# Patient Record
Sex: Female | Born: 1965 | Hispanic: No | Marital: Married | Smoking: Never smoker
Health system: Southern US, Community
[De-identification: ages and names within clinical notes are randomized; demographics above are authoritative.]

## PROBLEM LIST (undated history)

## (undated) DIAGNOSIS — N200 Calculus of kidney: Secondary | ICD-10-CM

## (undated) DIAGNOSIS — E559 Vitamin D deficiency, unspecified: Secondary | ICD-10-CM

## (undated) HISTORY — DX: Vitamin D deficiency, unspecified: E55.9

## (undated) HISTORY — DX: Calculus of kidney: N20.0

## (undated) HISTORY — PX: HERNIA REPAIR: SHX51

## (undated) HISTORY — PX: INGUINAL HERNIA REPAIR: SHX194

---

## 1998-01-08 ENCOUNTER — Other Ambulatory Visit: Admission: RE | Admit: 1998-01-08 | Discharge: 1998-01-08 | Payer: Self-pay | Admitting: *Deleted

## 1999-05-11 ENCOUNTER — Other Ambulatory Visit: Admission: RE | Admit: 1999-05-11 | Discharge: 1999-05-11 | Payer: Self-pay | Admitting: *Deleted

## 2000-05-22 ENCOUNTER — Other Ambulatory Visit: Admission: RE | Admit: 2000-05-22 | Discharge: 2000-05-22 | Payer: Self-pay | Admitting: *Deleted

## 2000-06-20 ENCOUNTER — Encounter: Admission: RE | Admit: 2000-06-20 | Discharge: 2000-06-20 | Payer: Self-pay | Admitting: *Deleted

## 2000-06-20 ENCOUNTER — Encounter: Payer: Self-pay | Admitting: *Deleted

## 2001-07-18 ENCOUNTER — Other Ambulatory Visit: Admission: RE | Admit: 2001-07-18 | Discharge: 2001-07-18 | Payer: Self-pay | Admitting: Obstetrics and Gynecology

## 2002-11-22 ENCOUNTER — Other Ambulatory Visit: Admission: RE | Admit: 2002-11-22 | Discharge: 2002-11-22 | Payer: Self-pay | Admitting: Obstetrics and Gynecology

## 2004-05-07 ENCOUNTER — Encounter: Admission: RE | Admit: 2004-05-07 | Discharge: 2004-05-07 | Payer: Self-pay | Admitting: Obstetrics and Gynecology

## 2005-06-24 ENCOUNTER — Encounter: Admission: RE | Admit: 2005-06-24 | Discharge: 2005-06-24 | Payer: Self-pay | Admitting: Obstetrics and Gynecology

## 2006-06-29 ENCOUNTER — Encounter: Admission: RE | Admit: 2006-06-29 | Discharge: 2006-06-29 | Payer: Self-pay | Admitting: Obstetrics and Gynecology

## 2007-08-02 ENCOUNTER — Encounter: Admission: RE | Admit: 2007-08-02 | Discharge: 2007-08-02 | Payer: Self-pay | Admitting: Obstetrics and Gynecology

## 2008-08-12 ENCOUNTER — Encounter: Admission: RE | Admit: 2008-08-12 | Discharge: 2008-08-12 | Payer: Self-pay | Admitting: Obstetrics and Gynecology

## 2009-08-14 ENCOUNTER — Encounter: Admission: RE | Admit: 2009-08-14 | Discharge: 2009-08-14 | Payer: Self-pay | Admitting: Obstetrics and Gynecology

## 2010-07-29 ENCOUNTER — Other Ambulatory Visit: Payer: Self-pay | Admitting: Obstetrics and Gynecology

## 2010-07-29 DIAGNOSIS — Z1231 Encounter for screening mammogram for malignant neoplasm of breast: Secondary | ICD-10-CM

## 2010-09-10 ENCOUNTER — Ambulatory Visit: Payer: Self-pay

## 2010-09-21 ENCOUNTER — Other Ambulatory Visit: Payer: Self-pay | Admitting: Obstetrics and Gynecology

## 2010-09-21 DIAGNOSIS — N644 Mastodynia: Secondary | ICD-10-CM

## 2010-09-21 LAB — HM MAMMOGRAPHY: HM Mammogram: NEGATIVE

## 2010-09-29 ENCOUNTER — Ambulatory Visit
Admission: RE | Admit: 2010-09-29 | Discharge: 2010-09-29 | Disposition: A | Discharge status: Home / Self Care | Payer: BC Managed Care – PPO | Source: Ambulatory Visit | Attending: Obstetrics and Gynecology | Admitting: Obstetrics and Gynecology

## 2010-09-29 DIAGNOSIS — N644 Mastodynia: Secondary | ICD-10-CM

## 2010-10-01 ENCOUNTER — Ambulatory Visit: Payer: Self-pay

## 2011-08-29 ENCOUNTER — Encounter: Payer: Self-pay | Admitting: Family Medicine

## 2011-08-29 ENCOUNTER — Ambulatory Visit (INDEPENDENT_AMBULATORY_CARE_PROVIDER_SITE_OTHER): Payer: BC Managed Care – PPO | Admitting: Family Medicine

## 2011-08-29 VITALS — BP 120/70 | HR 72 | Ht 65.25 in | Wt 144.0 lb

## 2011-08-29 DIAGNOSIS — Z0000 Encounter for general adult medical examination without abnormal findings: Secondary | ICD-10-CM

## 2011-08-29 LAB — POCT URINALYSIS DIPSTICK
Blood, UA: NEGATIVE
Glucose, UA: NEGATIVE
Spec Grav, UA: <=1.005
Urobilinogen, UA: NEGATIVE
pH, UA: 7

## 2011-08-29 LAB — COMPREHENSIVE METABOLIC PANEL
ALT: 11 U/L (ref 0–35)
CO2: 28 mEq/L (ref 19–32)
Calcium: 9.9 mg/dL (ref 8.4–10.5)
Chloride: 104 mEq/L (ref 96–112)
Creat: 0.86 mg/dL (ref 0.50–1.10)
Sodium: 141 mEq/L (ref 135–145)
Total Protein: 7.5 g/dL (ref 6.0–8.3)

## 2011-08-29 LAB — CBC WITH DIFFERENTIAL/PLATELET
Basophils Absolute: 0.1 10*3/uL (ref 0.0–0.1)
Lymphocytes Relative: 27 % (ref 12–46)
Neutro Abs: 3 10*3/uL (ref 1.7–7.7)
Platelets: 177 10*3/uL (ref 150–400)
RDW: 14 % (ref 11.5–15.5)
WBC: 5 10*3/uL (ref 4.0–10.5)

## 2011-08-29 LAB — TSH: TSH: 2.204 u[IU]/mL (ref 0.350–4.500)

## 2011-08-29 LAB — LIPID PANEL: Cholesterol: 257 mg/dL — ABNORMAL HIGH (ref 0–200)

## 2011-08-29 NOTE — Progress Notes (Signed)
Chief Complaint  Patient presents with  . Annual Exam    new patient CPE no pap-sees Marlinda Mike, PA @ [facility]. Last pap 09/20/2010-is scheduled for 09/22/2011 for pap. Hasn't seen a family physician in 8 years. Just wants to establish and have CPE. UA showed trace leuks, pt is asymptomatic.   She has no other specific concerns or complaints.  She recently lost a few friends from illness (ALS, pancreatic cancer) and would like full labwork done today, with copies to be sent to her GYN (NP Marlinda Mike).  No records are available from her GYN yet.  She has some questions regarding supplements for weight loss  Health maintenance: There is no immunization history on file for this patient. She recalls getting immunizations through GYN's office. Has appt next month, and if not UTD, will get booster there. Last Pap smear: 09/2010 Last mammogram: 09/2010 Last colonoscopy: never Last DEXA: never Dentist: once yearly Ophtho: yearly Exercise: 3-4 times/week (bike, elliptical and weights) Had labs through her GYN's office  Past Medical History  Diagnosis Date  . Kidney stone     during pregnancy    Past Surgical History  Procedure Date  . Inguinal hernia repair age 27    left    History   Social History  . Marital Status: Married    Spouse Name: N/A    Number of Children: 1  . Years of Education: N/A   Occupational History  . music teacher    Social History Main Topics  . Smoking status: Never Smoker   . Smokeless tobacco: Never Used  . Alcohol Use: Yes     1 glass 4-5 times per week.  . Drug Use: No  . Sexually Active: Not on file   Other Topics Concern  . 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  Married.  Son lives in [place]    Family History  Problem Relation Age of Onset  . Hypertension Mother   . Hypothyroidism Mother   . Hyperlipidemia Father     . Cancer Maternal Grandmother 70    stomach cancer  . Heart disease Neg Hx   . Diabetes Neg Hx   . Stroke Neg Hx     Current outpatient prescriptions:Multiple Vitamins-Minerals (WOMENS MULTI VITAMIN & MINERAL PO), Take 1 tablet by mouth daily., Disp: , Rfl:   Allergies  Allergen Reactions  . Penicillins Rash    Esp of the hans and feet.   ROS:  The patient denies anorexia, fever, weight changes, headaches,  vision changes, decreased hearing, ear pain, sore throat, breast concerns, chest pain, palpitations, dizziness, syncope, dyspnea on exertion, cough, swelling, nausea, vomiting, diarrhea, constipation, abdominal pain, melena, hematochezia, indigestion/heartburn, hematuria, incontinence, dysuria, irregular menstrual cycles, vaginal discharge, odor or itch, genital lesions, joint pains, numbness, tingling, weakness, tremor, suspicious skin lesions, depression, anxiety, abnormal bleeding/bruising, or enlarged lymph nodes.   PHYSICAL EXAM: BP 120/70  Pulse 72  Ht 5' 5.25" (1.657 m)  Wt 144 lb (65.318 kg)  BMI 23.78 kg/m2  LMP 06/29/2010  General Appearance:    Alert, cooperative, no distress, appears stated age  Head:    Normocephalic, without obvious abnormality, atraumatic  Eyes:    PERRL, conjunctiva/corneas clear, EOM's intact, fundi    benign  Ears:    Normal TM's and external ear canals  Nose:   Nares normal, mucosa normal, no drainage or sinus  tenderness  Throat:   Lips, mucosa, and tongue normal; teeth and gums normal  Neck:   Supple, no lymphadenopathy;  thyroid:  no   enlargement/tenderness/nodules; no carotid   bruit or JVD  Back:    Spine nontender, no curvature, ROM normal, no CVA     tenderness  Lungs:     Clear to auscultation bilaterally without wheezes, rales or     ronchi; respirations unlabored  Chest Wall:    No tenderness or deformity   Heart:    Regular rate and rhythm, S1 and S2 normal, no murmur, rub   or gallop  Breast Exam:    Deferred to GYN   Abdomen:     Soft, non-tender, nondistended, normoactive bowel sounds,    no masses, no hepatosplenomegaly  Genitalia:    Deferred to GYN     Extremities:   No clubbing, cyanosis or edema  Pulses:   2+ and symmetric all extremities  Skin:   Skin color, texture, turgor normal, no rashes or lesions  Lymph nodes:   Cervical, supraclavicular, and axillary nodes normal  Neurologic:   CNII-XII intact, normal strength, sensation and gait; reflexes 2+ and symmetric throughout          Psych:   Normal mood, affect, hygiene and grooming.     ASSESSMENT/PLAN:  1. Routine general medical examination at a health care facility  Visual acuity screening, POCT Urinalysis Dipstick, Comprehensive metabolic panel, CBC with Differential, Vitamin D 25 hydroxy, TSH, Lipid panel    Discussed monthly self breast exams and yearly mammograms after the age of 65; at least 30 minutes of aerobic activity at least 5 days/week; proper sunscreen use reviewed; healthy diet, including goals of calcium and vitamin D intake and alcohol recommendations (less than or equal to 1 drink/day) reviewed; regular seatbelt use; changing batteries in smoke detectors.  Immunization recommendations discussed--need to check on whether she had TdaP through GYN office.  Colonoscopy recommendations reviewed--age 55.  F/u prn

## 2011-08-29 NOTE — Patient Instructions (Signed)
HEALTH MAINTENANCE RECOMMENDATIONS:  It is recommended that you get at least 30 minutes of aerobic exercise at least 5 days/week (for weight loss, you may need as much as 60-90 minutes). This can be any activity that gets your heart rate up. This can be divided in 10-15 minute intervals if needed, but try and build up your endurance at least once a week.  Weight bearing exercise is also recommended twice weekly.  Eat a healthy diet with lots of vegetables, fruits and fiber.  "Colorful" foods have a lot of vitamins (ie green vegetables, tomatoes, red peppers, etc).  Limit sweet tea, regular sodas and alcoholic beverages, all of which has a lot of calories and sugar.  Up to 1 alcoholic drink daily may be beneficial for women (unless trying to lose weight, watch sugars).  Drink a lot of water.  Calcium recommendations are 1200-1500 mg daily (1500 mg for postmenopausal women or women without ovaries), and vitamin D 1000 IU daily.  This should be obtained from diet and/or supplements (vitamins), and calcium should not be taken all at once, but in divided doses.  Monthly self breast exams and yearly mammograms for women over the age of 84 is recommended.  Sunscreen of at least SPF 30 should be used on all sun-exposed parts of the skin when outside between the hours of 10 am and 4 pm (not just when at beach or pool, but even with exercise, golf, tennis, and yard work!)  Use a sunscreen that says "broad spectrum" so it covers both UVA and UVB rays, and make sure to reapply every 1-2 hours.  Remember to change the batteries in your smoke detectors when changing your clock times in the spring and fall.  Use your seat belt every time you are in a car, and please drive safely and not be distracted with cell phones and texting while driving.  TdaP (tetanus and pertussis) booster is recommended if you haven't had one in 10 years (even if the tetanus component is less than 10 years).

## 2011-08-30 ENCOUNTER — Encounter: Payer: Self-pay | Admitting: Family Medicine

## 2011-09-01 ENCOUNTER — Other Ambulatory Visit: Payer: Self-pay | Admitting: Obstetrics and Gynecology

## 2011-09-01 DIAGNOSIS — Z1231 Encounter for screening mammogram for malignant neoplasm of breast: Secondary | ICD-10-CM

## 2011-10-04 ENCOUNTER — Ambulatory Visit: Payer: BC Managed Care – PPO

## 2011-10-28 ENCOUNTER — Ambulatory Visit: Payer: BC Managed Care – PPO

## 2011-11-09 ENCOUNTER — Ambulatory Visit
Admission: RE | Admit: 2011-11-09 | Discharge: 2011-11-09 | Disposition: A | Discharge status: Home / Self Care | Payer: BC Managed Care – PPO | Source: Ambulatory Visit | Attending: Obstetrics and Gynecology | Admitting: Obstetrics and Gynecology

## 2011-11-09 DIAGNOSIS — Z1231 Encounter for screening mammogram for malignant neoplasm of breast: Secondary | ICD-10-CM

## 2011-11-24 ENCOUNTER — Other Ambulatory Visit (INDEPENDENT_AMBULATORY_CARE_PROVIDER_SITE_OTHER): Payer: BC Managed Care – PPO

## 2011-11-24 DIAGNOSIS — Z23 Encounter for immunization: Secondary | ICD-10-CM

## 2012-10-08 ENCOUNTER — Other Ambulatory Visit: Payer: Self-pay

## 2012-10-08 DIAGNOSIS — Z1231 Encounter for screening mammogram for malignant neoplasm of breast: Secondary | ICD-10-CM

## 2012-11-12 ENCOUNTER — Ambulatory Visit
Admission: RE | Admit: 2012-11-12 | Discharge: 2012-11-12 | Disposition: A | Discharge status: Home / Self Care | Payer: BC Managed Care – PPO | Source: Ambulatory Visit

## 2012-11-12 DIAGNOSIS — Z1231 Encounter for screening mammogram for malignant neoplasm of breast: Secondary | ICD-10-CM

## 2013-10-21 ENCOUNTER — Other Ambulatory Visit: Payer: Self-pay

## 2013-10-21 DIAGNOSIS — Z1231 Encounter for screening mammogram for malignant neoplasm of breast: Secondary | ICD-10-CM

## 2013-11-04 ENCOUNTER — Encounter: Payer: Self-pay | Admitting: Family Medicine

## 2013-11-13 ENCOUNTER — Ambulatory Visit
Admission: RE | Admit: 2013-11-13 | Discharge: 2013-11-13 | Disposition: A | Discharge status: Home / Self Care | Payer: BC Managed Care – PPO | Source: Ambulatory Visit

## 2013-11-13 DIAGNOSIS — Z1231 Encounter for screening mammogram for malignant neoplasm of breast: Secondary | ICD-10-CM

## 2013-11-15 ENCOUNTER — Other Ambulatory Visit: Payer: Self-pay | Admitting: Obstetrics and Gynecology

## 2013-11-15 DIAGNOSIS — R928 Other abnormal and inconclusive findings on diagnostic imaging of breast: Secondary | ICD-10-CM

## 2013-12-03 ENCOUNTER — Ambulatory Visit
Admission: RE | Admit: 2013-12-03 | Discharge: 2013-12-03 | Disposition: A | Discharge status: Home / Self Care | Payer: BC Managed Care – PPO | Source: Ambulatory Visit | Attending: Obstetrics and Gynecology | Admitting: Obstetrics and Gynecology

## 2013-12-03 ENCOUNTER — Other Ambulatory Visit: Payer: Self-pay | Admitting: Obstetrics and Gynecology

## 2013-12-03 DIAGNOSIS — R928 Other abnormal and inconclusive findings on diagnostic imaging of breast: Secondary | ICD-10-CM

## 2014-05-12 ENCOUNTER — Other Ambulatory Visit: Payer: Self-pay | Admitting: Obstetrics and Gynecology

## 2014-05-12 DIAGNOSIS — N6489 Other specified disorders of breast: Secondary | ICD-10-CM

## 2014-06-09 ENCOUNTER — Ambulatory Visit
Admission: RE | Admit: 2014-06-09 | Discharge: 2014-06-09 | Disposition: A | Discharge status: Home / Self Care | Payer: BLUE CROSS/BLUE SHIELD | Source: Ambulatory Visit | Attending: Obstetrics and Gynecology | Admitting: Obstetrics and Gynecology

## 2014-06-09 ENCOUNTER — Other Ambulatory Visit: Payer: Self-pay | Admitting: Obstetrics and Gynecology

## 2014-06-09 DIAGNOSIS — N6489 Other specified disorders of breast: Secondary | ICD-10-CM

## 2014-07-10 ENCOUNTER — Other Ambulatory Visit: Payer: Self-pay | Admitting: Radiology

## 2014-07-10 LAB — HM MAMMOGRAPHY

## 2014-10-07 ENCOUNTER — Ambulatory Visit (INDEPENDENT_AMBULATORY_CARE_PROVIDER_SITE_OTHER): Payer: BLUE CROSS/BLUE SHIELD | Admitting: Family Medicine

## 2014-10-07 ENCOUNTER — Encounter: Payer: Self-pay | Admitting: Family Medicine

## 2014-10-07 VITALS — BP 102/60 | HR 64 | Ht 65.25 in | Wt 140.6 lb

## 2014-10-07 DIAGNOSIS — M79672 Pain in left foot: Secondary | ICD-10-CM | POA: Diagnosis not present

## 2014-10-07 DIAGNOSIS — Z23 Encounter for immunization: Secondary | ICD-10-CM | POA: Diagnosis not present

## 2014-10-07 NOTE — Progress Notes (Signed)
Chief Complaint  Patient presents with  . Foot Pain    left foot pain, started about 3 weeks ago. Thinks she wore a pair of heels that hurt her foot. Went to [place] and she did injure her left foot, hit the top of her foot of metal table. Iced it, was throbbing. Still bothering her.    Patient presents with complaint of left foot pain, and concern about new deformity at the base of her left great toe.  3 weeks ago developed left foot pain, "like a wrong shoe"; she had worn a new shoe. Pain was in her arch, and medial base of foot. She then hit a metal table 2 weeks ago with the top of her left foot--iced it immediately, walked on it x 2 hrs the net day, pain was worse.  Elevated and treated with ibuprofen. She noticed some bruising, there is still a mark on it, no significant swelling (just mild).  But aching and pain persisted. She scheduled the visit last week, when she noticed prominence at the base of her left great toe, different from the right foot.  It is only slightly tender.  The area on the top of the left foot is feeling better. She hasn't been exercising, wearing flat shoes.  She hasn't been seen in this office for over 3 years. History was reviewed and updated:  Past Medical History  Diagnosis Date  . Kidney stone     during pregnancy   Past Surgical History  Procedure Laterality Date  . Inguinal hernia repair  age 41    left   Social History   Social History  . Marital Status: Married    Spouse Name: N/A  . Number of Children: 1  . Years of Education: N/A   Occupational History  . music teacher    Social History Main Topics  . Smoking status: Never Smoker   . Smokeless tobacco: Never Used  . Alcohol Use: Yes     Comment: 1 glass 4-5 times per week.  . Drug Use: No  . Sexual Activity: Not on file   Other Topics Concern  . 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  Son lives in [place] (moved there from [place])   Family History  Problem Relation Age of Onset  . Hypertension Mother   . Hypothyroidism Mother   . Hyperlipidemia Mother   . Hyperlipidemia Father   . Cancer Maternal Grandmother 52    stomach cancer  . Heart disease Neg Hx   . Diabetes Neg Hx   . Stroke Neg Hx   . Hypertension Sister    Outpatient Encounter Prescriptions as of 10/07/2014  Medication Sig  . [DISCONTINUED] Multiple Vitamins-Minerals (WOMENS MULTI VITAMIN & MINERAL PO) Take 1 tablet by mouth daily.   No facility-administered encounter medications on file as of 10/07/2014.   Allergies  Allergen Reactions  . Penicillins Rash    Esp of the hans and feet.   ROS: no fever, chills, URI symptoms, chest pain, cough, shortness of breath, GI or GU symptoms, bleeding, bruising, rash, other joint pains, depression, sleep problems.  Some fatigue related to menopause.  See HPI  PHYSICAL EXAM: BP 102/60 mmHg  Pulse 64  Ht 5' 5.25" (1.657 m)  Wt 140 lb 9.6 oz (63.776 kg)  BMI 23.23 kg/m2 Well developed, pleasant female in no distress HEENT: PERRL, EOMI,  conjunctiva clear, OP clear Neck: no lymphadenopathy, thyromegaly or mass Heart: regular rate and rhythm Lungs: clear bilaterally Abdomen: soft, nontender, no mass Extremities:  Slight red mark is noted in her left anterior forefoot, (proximal forefoot, in line with 2nd toe). No bony tenderness. 2+ pulses.  Her concern is the medial aspect of the left first MTP--it is a little more prominent than on the right, and there is very slight angiulation of left great toe, otherwise normal.  Minimally tender over the medial portion of left 1st MTP. No erythema, warmth or soft tissue swelling  ASSESSMENT/PLAN:  Left foot pain - suspect contusion, healing (cant r/o slight fx, no difference in treatment). doesn't meet criteria for bunion deformity. proper shoewear discussed.  Need for prophylactic  vaccination and inoculation against influenza - Plan: Flu Vaccine QUAD 36+ mos PF IM (Fluarix & Fluzone Quad PF)  Sign ROR for GYN (labs and visits) Schedule CPE   Discussed proper shoe-wear, how to resume and advance physical activity. Discussed NSAID's and/or tylenol prn. Doubt fracture given that she is no longer having any bony tenderness. Discussed Ddx for her concern at first MTP--arthritis, bunion, vs normal variant. F/u at CPE, sooner prn.

## 2014-10-07 NOTE — Patient Instructions (Signed)
You can resume exercise--start gradually.  Start with bike, elliptical, advance as tolerated. Listen to your body and back off if you develop pain and swelling. I think you have some very early changes that could result in a bunion, but not technically a bunion.  And if there is no pain with the prominence that you notice, no treatment is needed.  Just make sure to wear shoes that aren't too narrow.  If you have recurrent swelling, worsening pain, next step would be an x-ray, along with using an anti-inflammatory such as Aleve or Motrin as needed.

## 2015-05-08 ENCOUNTER — Encounter: Payer: Self-pay | Admitting: Family Medicine

## 2015-05-08 ENCOUNTER — Ambulatory Visit
Admission: RE | Admit: 2015-05-08 | Discharge: 2015-05-08 | Disposition: A | Discharge status: Home / Self Care | Payer: BLUE CROSS/BLUE SHIELD | Source: Ambulatory Visit | Attending: Family Medicine | Admitting: Family Medicine

## 2015-05-08 ENCOUNTER — Ambulatory Visit (INDEPENDENT_AMBULATORY_CARE_PROVIDER_SITE_OTHER): Payer: BLUE CROSS/BLUE SHIELD | Admitting: Family Medicine

## 2015-05-08 VITALS — BP 102/62 | HR 76 | Ht 65.25 in | Wt 145.6 lb

## 2015-05-08 DIAGNOSIS — M25572 Pain in left ankle and joints of left foot: Secondary | ICD-10-CM

## 2015-05-08 IMAGING — CR DG ANKLE COMPLETE 3+V*L*
3 series · 3 of 3 positions shown · non-contrast
Comparison: None.

CLINICAL DATA: Fall 5 weeks ago

EXAM:
LEFT ANKLE COMPLETE - 3+ VIEW

[x ankle ap left]
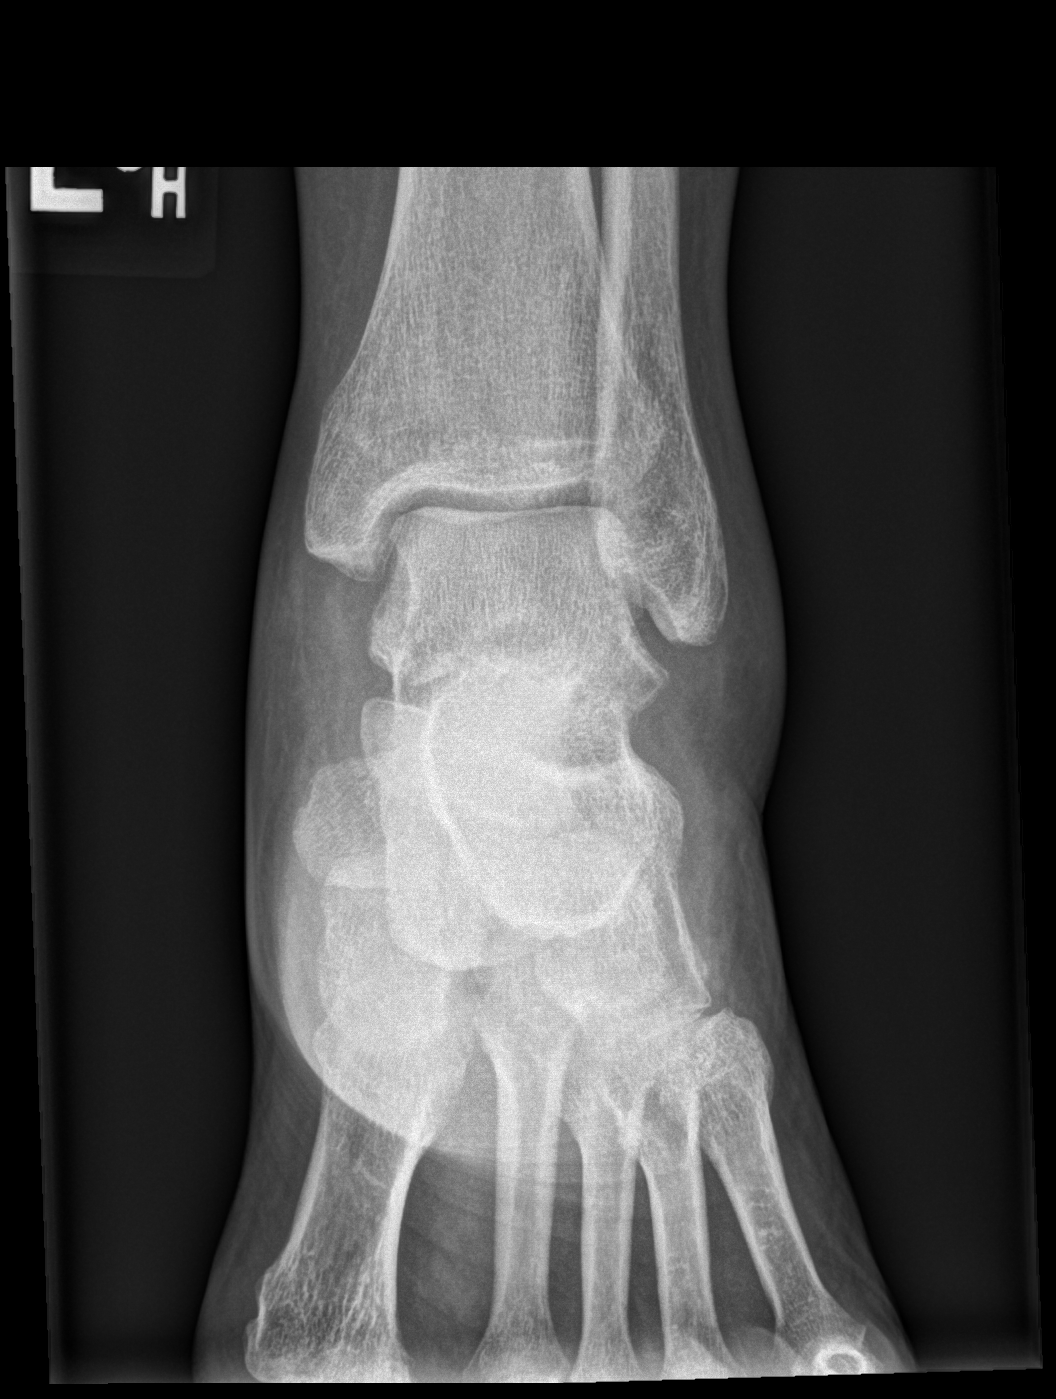

[x ankle obl left]
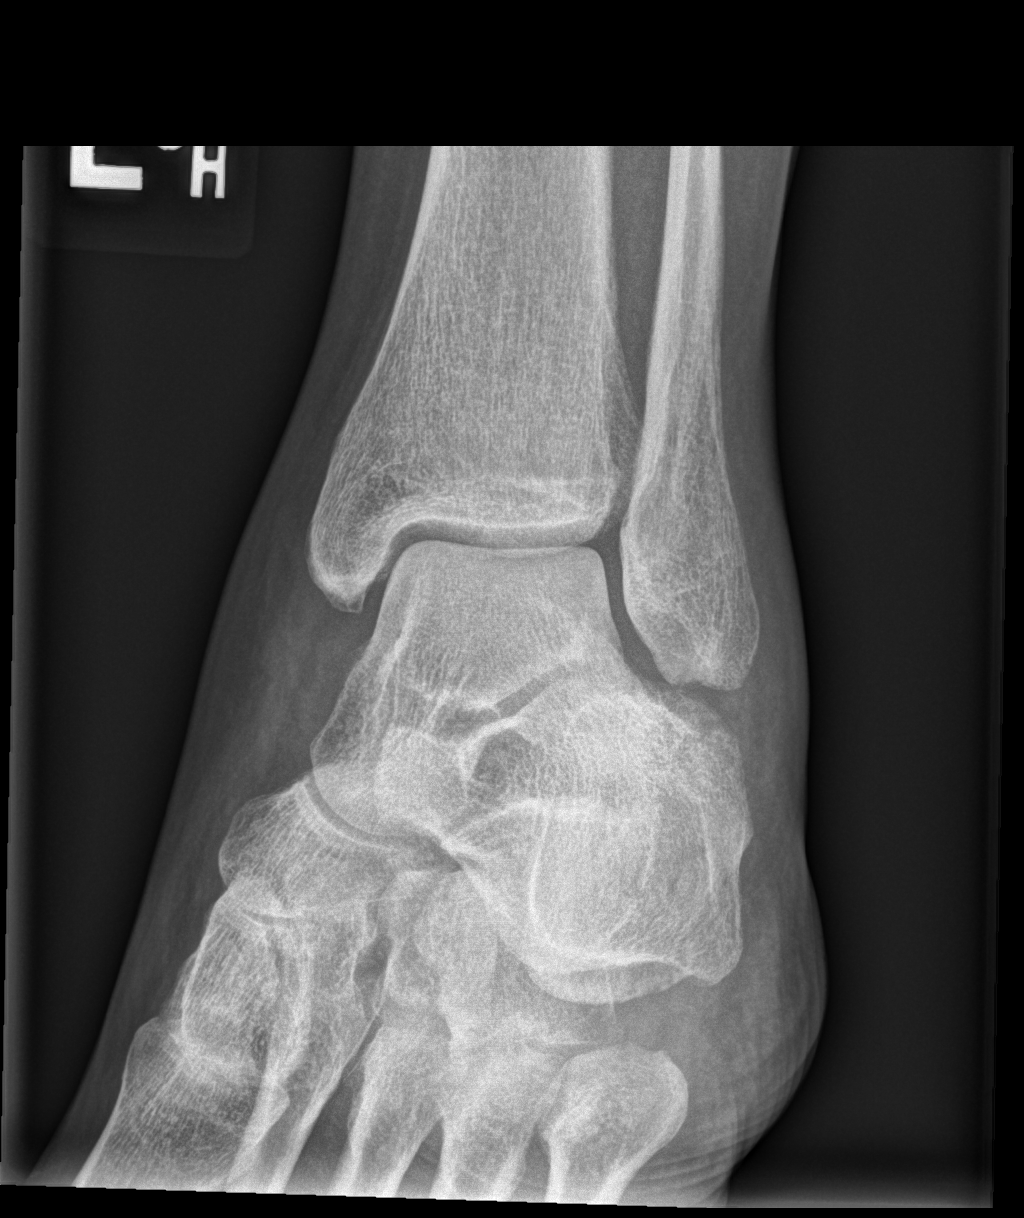

[x ankle lat left]
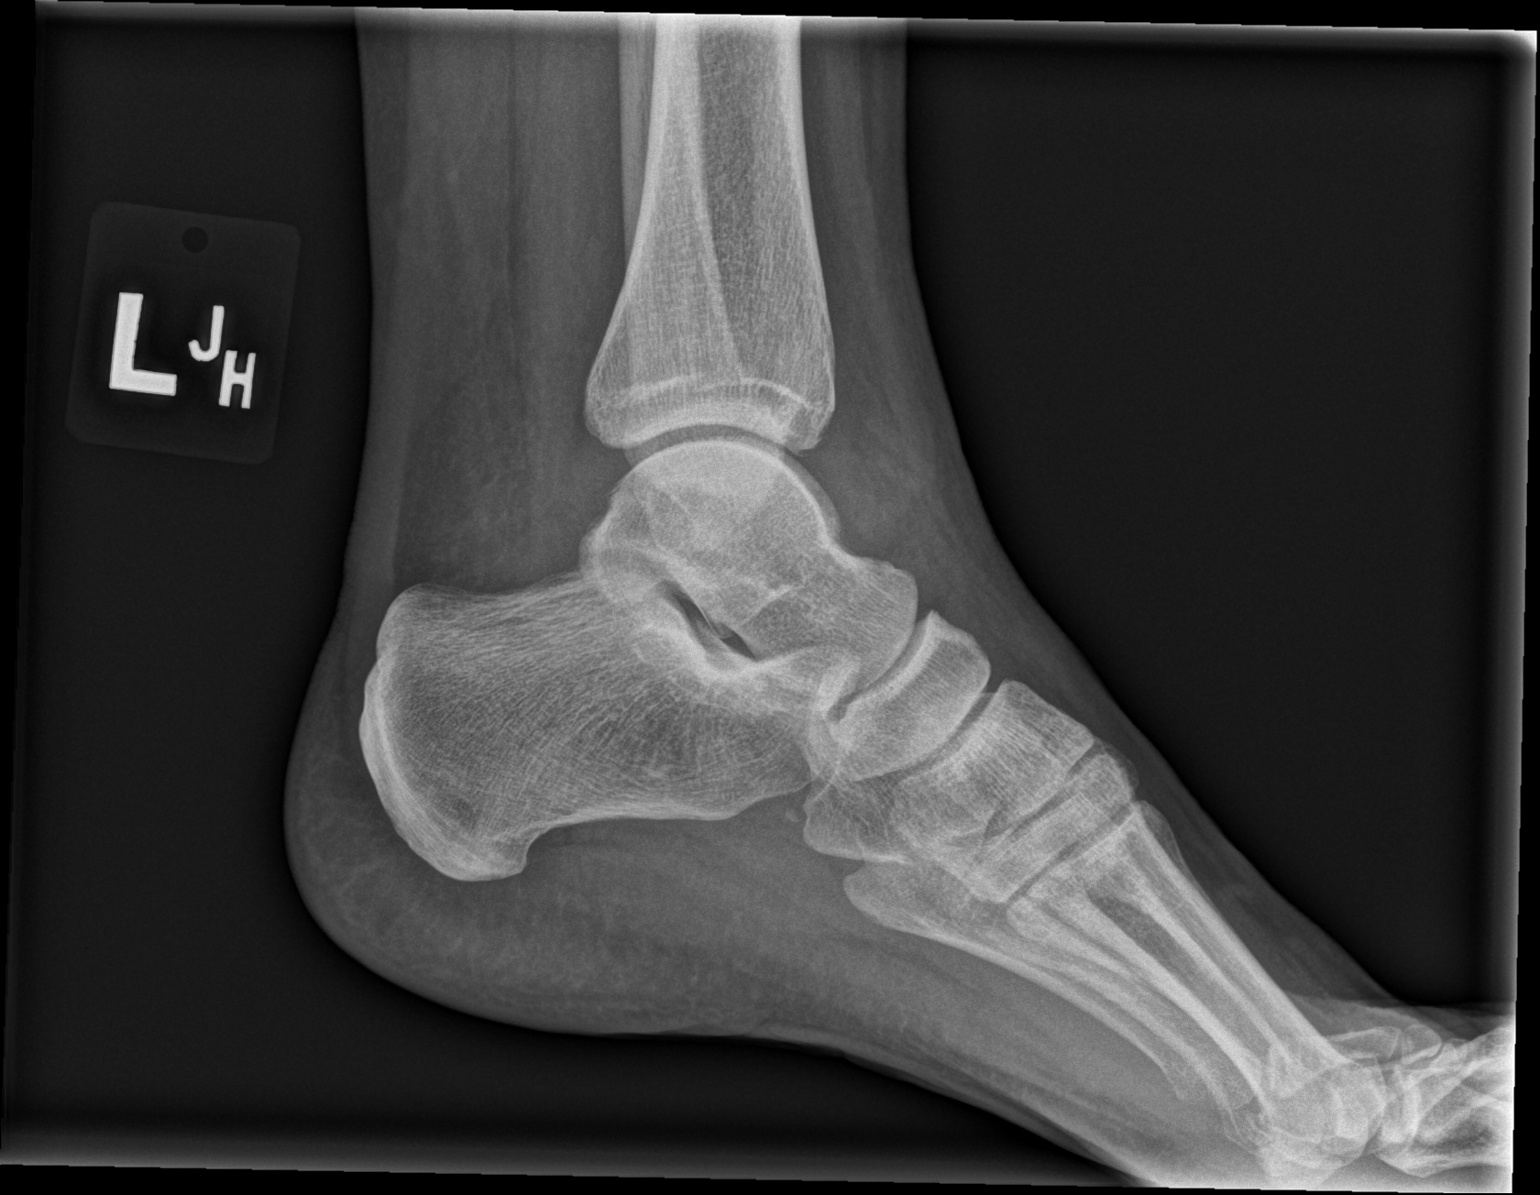

[3 of 3 positions shown; findings below may reference images not displayed]

FINDINGS: No acute fracture. No dislocation. Soft tissue swelling about the
ankle joint is noted.
IMPRESSION: No acute bony injury.  Soft tissue swelling is noted.

## 2015-05-08 MED ORDER — MELOXICAM 7.5 MG PO TABS: 7.5000 mg | ORAL_TABLET | Freq: Every day | ORAL | Status: DC

## 2015-05-08 NOTE — Progress Notes (Signed)
Chief Complaint  Patient presents with  . Foot Pain    having left foot pain and did have a fall 5 weeks ago from today and sprained her left ankle. Not completely better, would like to have an xray today-has not had one at all.    5 weeks ago she was walking her dog, there was a slant in the ground, twisted her left ankle and fell.  She had prior injury to this foot (see October visit).  She twisted the left ankle (inverted),  landed on the right knee and hit her right lateral ankle.  Everything on the right has resolved.  She showed me pictures on her phone of her left ankle--there was significant swelling and bruising of the entire left foot and lateral ankle.  She reports being unable to bear weight on the left ankle for about 2 days.  Since then, she has been able to bear weight, the swelling and bruising resolved gradually. She wore a foot brace, compression sock/bandage.  She has done everything she can think of, but still has some ongoing discomfort.  She is anxious to get back to the gym. She has some persistwent swelling laterally, decreased ROM of the ankle .She has some soreness with movement of her ankle left and right.  She has discomfort coming down stairs due to pain (laterally, and across the front of the ankle).  She took sporadic ibuprofen and extra strength tylenol, as needed for pain. Last took ibuprofen  2-3 days ago.     DOI 04/03/15  PMH, PSH SH reviewed  No current outpatient prescriptions on file prior to visit.   No current facility-administered medications on file prior to visit.   Allergies  Allergen Reactions  . Penicillins Rash    Esp of the hans and feet.   ROS: no fever, chills, URI symptoms, chest pain, headaches, dizziness, nausea, vomiting, abdominal pain, other bleeding/bruising or rashes.  See HPI  PHYSICAL EXAM: BP 102/62 mmHg  Pulse 76  Ht 5' 5.25" (1.657 m)  Wt 145 lb 9.6 oz (66.044 kg)  BMI 24.05 kg/m2  Pleasant female in no  distress. RLE: no edema, normal pulses, bruising resolved (only minimal faint yellow discoloration, barely noticeable).  Bones are nontender, negative drawer, FROM.  L foot/ankle: Minor residual yellow bruising noted at the distal metatarsals 2nd through 4th, and across the forefoot.  There is moderate swelling of the ankle.  She is very tender to palpation over the distal fibula and lateral malleolus.  nontender over the ligaments inferior to the malleolus and no pain with ankle movements. Intact drawer.  2+ distal pulses  ASSESSMENT/PLAN:  Left ankle pain - Plan: DG Ankle Complete Left, meloxicam (MOBIC) 7.5 MG tablet    Left ankle pain--significant injury with inability to bear weight and significant swelling and bruising.  Suspect fracture or significant soft tissue injury. Send for x-ray. If x-ray is negative, trial of NSAID x 10 days and f/u with ortho if not improving. If x-ray shows fracture--refer to ortho  meloxicam once daily NSAID precautions reviewed.   Go to [facility] ([address]) for x-rays right after your visit today. Take the meloxicam once daily with food, every day. If it bothers your stomach, cut it in half or stop taking it.  Do not use aleve, ibuprofen, naproxen, Goody, BC, aspirin or any other OTC pain medications along with the meloxicam.  You CAN take acetaminophen (tylenol) products along with it, if needed for pain control. If x-ray  is normal, continue to use ACE wrap or ankle brace.

## 2015-05-08 NOTE — Patient Instructions (Signed)
  Go to [facility] ([address]) for x-rays right after your visit today. Take the meloxicam once daily with food, every day. If it bothers your stomach, cut it in half or stop taking it.  Do not use aleve, ibuprofen, naproxen, Goody, BC, aspirin or any other OTC pain medications along with the meloxicam.  You CAN take acetaminophen (tylenol) products along with it, if needed for pain control. If x-ray is normal, continue to use ACE wrap or ankle brace.

## 2015-05-09 ENCOUNTER — Encounter: Payer: Self-pay | Admitting: Family Medicine

## 2015-12-07 ENCOUNTER — Ambulatory Visit (INDEPENDENT_AMBULATORY_CARE_PROVIDER_SITE_OTHER): Payer: BLUE CROSS/BLUE SHIELD | Admitting: Family Medicine

## 2015-12-07 ENCOUNTER — Encounter: Payer: Self-pay | Admitting: Family Medicine

## 2015-12-07 VITALS — BP 108/70 | HR 68 | Temp 100.6°F | Ht 65.5 in | Wt 146.8 lb

## 2015-12-07 DIAGNOSIS — J302 Other seasonal allergic rhinitis: Secondary | ICD-10-CM

## 2015-12-07 DIAGNOSIS — J069 Acute upper respiratory infection, unspecified: Secondary | ICD-10-CM | POA: Diagnosis not present

## 2015-12-07 NOTE — Progress Notes (Signed)
Chief Complaint  Patient presents with  . Nasal Congestion    had lots of cold air blowing on her. Mucus is a tad green. Started last Saturday after being at a funeral. No fevers. Husband was sick and an abx. 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 (instrumental)   Eight days ago she started with hoarseness, then some fatigue, some nasal stuffiness, a cough. Mucus was very slightly discolored at the beginning of the illness (not now).  She lost her voice a week ago.  Symptoms started after sitting under an AC at a funeral for 2 hours.   She started taking Claritin-D and immediately felt better, congestion improved. Cough continued.  Voice came back over the last 3 days. She stopped the Claritin-D 2 days ago, but denies any recurrent sinus pain.  Mucus from the nose is clear.  Cough is nonproductive, not able to expectorate anything.  Denies any known fever, chills. +sick contact--husband (2 weeks ago was sick for a week, required ABX).  Slight hoarseness, ongoing head congestion and dry cough are persistent symptoms. She hadn't been aware of fever.  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 [DISCONTINUED] meloxicam (MOBIC) 7.5 MG tablet Take 1 tablet (7.5 mg total) by mouth daily.   No facility-administered encounter medications on file as of 12/07/2015.    Allergies  Allergen Reactions  . Penicillins Rash    Esp of the hans and feet.   ROS: no known fever (noted here today), chills.  Denies nausea, vomiting, headache, diarrhea, urinary complaints, bleeding, bruising, rash See HPI.  PHYSICAL EXAM:  BP 108/70 (BP Location: Left Arm, Patient Position: Sitting, Cuff Size: Normal)   Pulse 68   Temp (!) 100.6 F (38.1 C) (Tympanic)   Ht 5' 5.5" (1.664 m)   Wt 146 lb 12.8 oz (66.6 kg)   BMI 24.06 kg/m   Well appearing, pleasant  female in no distress HEENT: PERRL, EOMI, conjunctiva and sclera are clear. TM's and EAC's normal. Nasal mucosa is mildly edematous, pale, clear mucus on the left. Sinuses nontender. OP is clear Neck; no lymphadenopathy or mass Heart: regular rate and rhythm without murmur Lungs: clear bilaterally Neuro: alert and oriented, cranial nerves intact, normal strength, gait Psych: normal mood, affect, hygiene and grooming  ASSESSMENT/PLAN:  Acute seasonal allergic rhinitis, unspecified trigger  Acute upper respiratory infection  Allergies vs URI.  Suspect URI given low grade fever, and course of illness (with discolored mucus initially).  Current exam also suggests some allergies.    Drink plenty of water. Continue the Claritin-D. Add mucinex (Guaifenesin, expectorant). If you are having a worsening cough, and need a cough suppressant, you can take delsym syrup along with these medications, vs changing the mucinex to a mucinex DM or Robitussin DM. Contact us if you develop discolored mucus, worsening sinus pain, ongoing fever. Return for re-evaluation if you develop pain with breathing, shortness of breath. You may use tylenol or ibuprofen as needed for headache or fever.

## 2015-12-07 NOTE — Patient Instructions (Signed)
  Drink plenty of water. Continue the Claritin-D. Add mucinex (Guaifenesin, expectorant). If you are having a worsening cough, and need a cough suppressant, you can take delsym syrup along with these medications, vs changing the mucinex to a mucinex DM or Robitussin DM. Contact us if you develop discolored mucus, worsening sinus pain, ongoing fever. Return for re-evaluation if you develop pain with breathing, shortness of breath. You may use tylenol or ibuprofen as needed for headache or fever.

## 2016-08-16 ENCOUNTER — Telehealth: Payer: Self-pay

## 2016-08-16 NOTE — Telephone Encounter (Signed)
I obviously won't be able to look at the forms prior to her visit Thursday, since I'm out of the office. I do think a visit is reasonable, since she was only seen for acute visit (and last PE in 2013).  A good opportunity to review immunizations, etc and fill out form (since there would be a form fee anyway).  Only other option would be for her GYN to fill out the form, since it appears she has seen them (since bringing labs).  I will see her Thursday!

## 2016-08-16 NOTE — Telephone Encounter (Signed)
Pt dropped off school health examination form and labwork from GYN. Pt was last seen in Dec for acute visit. Advised pt need for appt. Pt wanted to me to question and see if indeed appt is needed. Appt has been scheduled for Thurs at 1030. Forms placed in your folder for appt Thursday. CB # [phone]. Trixie Rude/RLB

## 2016-08-17 NOTE — Telephone Encounter (Signed)
Pt notified of need for appt. April Mcintyre/RLB

## 2016-08-18 ENCOUNTER — Encounter: Payer: Self-pay | Admitting: Family Medicine

## 2016-08-18 ENCOUNTER — Ambulatory Visit (INDEPENDENT_AMBULATORY_CARE_PROVIDER_SITE_OTHER): Payer: BLUE CROSS/BLUE SHIELD | Admitting: Family Medicine

## 2016-08-18 VITALS — BP 120/68 | HR 64 | Ht 65.5 in | Wt 139.8 lb

## 2016-08-18 DIAGNOSIS — E875 Hyperkalemia: Secondary | ICD-10-CM | POA: Diagnosis not present

## 2016-08-18 DIAGNOSIS — E781 Pure hyperglyceridemia: Secondary | ICD-10-CM

## 2016-08-18 DIAGNOSIS — E559 Vitamin D deficiency, unspecified: Secondary | ICD-10-CM

## 2016-08-18 DIAGNOSIS — Z23 Encounter for immunization: Secondary | ICD-10-CM | POA: Diagnosis not present

## 2016-08-18 NOTE — Progress Notes (Signed)
Chief Complaint  Patient presents with  . Advice Only    fill out form and Tdap.   Patient presents with form to be filled out for her new job. 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  She started yesterday (for teachers, kids haven't started yet). She is still doing Engineer, production as well.  She had dropped off copies of labs done by her GYN. She reports she had h/o elevated triglycerides.  She cut back on her sugar and reports recent labs were improved (see below). She does a protein shake daily, and takes [place] >50 MVI sporadically.  She had labs done recently at GYN:  CBC--WBC 3.9, otherwise normal c-met had K+ 5.3 (eats banana in shake daily, plus her MVI).  Hasn't been high in the past, wasn't a hard stick. Rest of chem was normal. Lipids: total 216, TG 54, HDL 95, LDL 110 (previously reports that her TG were elevated). A1c 5.4% TSH normal 2.570 Vitamin D-OH 30. Yearly mammograms Hasn't had colonoscopy yet, did discuss with her GYN  Last tetanus shot was in 2003  PMH, [place], [place], immunizations reviewed/updated  No outpatient encounter prescriptions on file as of 08/18/2016.   No facility-administered encounter medications on file as of 08/18/2016.    Allergies  Allergen Reactions  . Penicillins Rash    Esp of the hans and feet.   ROS:  Denies fever, chills, URI symptoms, cough, shortness of breath, chest pain, palpitations, headaches, dizziness. Denies GI or GU complaints.  She is postmenopausal, mild hot flashes only.  She has lost 11# (per her home scale) intentionally with cutting back on sugar and carbs in her diet.  Moods are good, negative PHQ-2 depression screen.  PHYSICAL EXAM:  BP 120/68 (BP Location: Left Arm, Patient Position: Sitting, Cuff Size: Normal)   Pulse 64   Ht 5' 5.5" (1.664 m)   Wt 139 lb 12.8 oz (63.4 kg)   BMI 22.91 kg/m   Wt Readings from Last 3 Encounters:  08/18/16 139 lb 12.8 oz (63.4 kg)  12/07/15 146 lb 12.8 oz  (66.6 kg)  05/08/15 145 lb 9.6 oz (66 kg)   Pleasant, well appearing female in no distress HEENT: PERRL, EOMI, conjunctiva and sclera are clear, OP clear Neck: no lymphadenopathy, thyromegaly or carotid bruit Heart: regular rate and rhythm, no murmur Lungs: clear bilaterally Back: no spinal or CVA tenderness Abdomen: soft, nontender, no organomegaly or mass Extremities: no edema, normal pulses Neuro: alert and oriented, normal cranial nerves, strength, gait Psych: normal mood, affect, hygiene and grooming   ASSESSMENT/PLAN:  Need for tetanus, diphtheria, and acellular pertussis (Tdap) vaccine - Plan: Tdap vaccine greater than or equal to 7yo IM  Hyperkalemia - not likely significant.  Okay to continue banana in shake.  likely doesn't need MVI, to change to separate D3 daily  Vitamin D deficiency - borderline low value with sporadic use of MVI. Encouraged her to take 1000 IU of D3 daily (in place of MVI, taken in the evening, more regularly)  Hypertriglyceridemia - resolved with dietary changes. Her lipids are excellent.  Forms filled out for school. Encouraged her to schedule colonoscopy. Discussed Shingrix (risks, side effects, benefits, cost). Risks/side effects, benefits also reviewed for TdaP and all questions answered.  30 minute visit, more than 1/2 spent counseling.

## 2016-08-18 NOTE — Patient Instructions (Addendum)
Please check at your next visit to see if you ever had Hepatitis C screening test with labs done through your GYN. It is recommended that you be screened once (due to being born between [id number]).  I recommend getting the new shingles vaccine (Shingrix). You will need to check with your insurance to see if it is covered, and if covered by Medicare Part D, you need to get from the pharmacy rather than our office.  It is a series of 2 injections, spaced 2 months apart. I recommend waiting until you see it advertised again, or in 2019 when there is better availability. The second dose needs to be within 6 months.

## 2017-06-07 LAB — COLOGUARD: Cologuard: NEGATIVE

## 2018-03-07 ENCOUNTER — Other Ambulatory Visit (INDEPENDENT_AMBULATORY_CARE_PROVIDER_SITE_OTHER): Payer: BLUE CROSS/BLUE SHIELD

## 2018-03-07 DIAGNOSIS — Z23 Encounter for immunization: Secondary | ICD-10-CM | POA: Diagnosis not present

## 2018-10-16 LAB — HM PAP SMEAR: HM Pap smear: NEGATIVE

## 2019-01-23 LAB — HM MAMMOGRAPHY

## 2019-01-24 ENCOUNTER — Encounter: Payer: Self-pay | Admitting: *Deleted

## 2019-01-24 ENCOUNTER — Telehealth: Payer: Self-pay | Admitting: *Deleted

## 2019-01-24 NOTE — Telephone Encounter (Signed)
Yes, that it was I was thinking. She was on my list for colonoscopies. Fax sent.

## 2019-01-24 NOTE — Telephone Encounter (Signed)
Called patient to see I was able to get her scheduled for a CPE since we had not see her since 2018. She said she had just seen [facility] and she also did Cologuard through them as well. She said she feels like she is doing just fine and did all of what she needed with them and doesn't need to scheduled at the moment.

## 2019-01-24 NOTE — Telephone Encounter (Signed)
Should we try and get info to abstract into her chart?

## 2019-02-04 ENCOUNTER — Encounter: Payer: Self-pay | Admitting: Family Medicine

## 2020-01-31 LAB — HM MAMMOGRAPHY

## 2021-02-05 LAB — HM MAMMOGRAPHY

## 2021-06-24 ENCOUNTER — Encounter: Payer: BLUE CROSS/BLUE SHIELD | Admitting: Family Medicine

## 2021-07-20 NOTE — Progress Notes (Unsigned)
  No chief complaint on file.  Patient hasn't been seen in this office since 08/2016. She sees GYN regularly, but records are not routinely sent (we last requested records and updated chart 01/2019).   PMH, PSH, SH reviewed and updated   PHYSICAL EXAM:  There were no vitals taken for this visit.  Wt Readings from Last 3 Encounters:  08/18/16 139 lb 12.8 oz (63.4 kg)  12/07/15 146 lb 12.8 oz (66.6 kg)  05/08/15 145 lb 9.6 oz (66 kg)       ASSESSMENT/PLAN:  Was cologuard repeated since 2019? Mammo since 01/2019? Any labs since 10/2018?  I'd really like for her to have CPE Need to update imms--COVID vaccines, flu shot, shingrix?? Really don't have time during acute visit--feel free to add if you find these, but I'm not going to try and do her preventative stuff at an acute visit  Bill as NEW

## 2021-07-21 ENCOUNTER — Ambulatory Visit (INDEPENDENT_AMBULATORY_CARE_PROVIDER_SITE_OTHER): Payer: BC Managed Care – PPO | Admitting: Family Medicine

## 2021-07-21 ENCOUNTER — Encounter: Payer: Self-pay | Admitting: Family Medicine

## 2021-07-21 VITALS — BP 132/74 | HR 72 | Ht 65.0 in | Wt 144.4 lb

## 2021-07-21 DIAGNOSIS — M62838 Other muscle spasm: Secondary | ICD-10-CM | POA: Diagnosis not present

## 2021-07-21 DIAGNOSIS — M542 Cervicalgia: Secondary | ICD-10-CM | POA: Diagnosis not present

## 2021-07-21 DIAGNOSIS — Z7185 Encounter for immunization safety counseling: Secondary | ICD-10-CM | POA: Diagnosis not present

## 2021-07-21 MED ORDER — METHOCARBAMOL 500 MG PO TABS: 500.0000 mg | ORAL_TABLET | Freq: Three times a day (TID) | ORAL | 0 refills | Status: DC | PRN

## 2021-07-21 MED ORDER — MELOXICAM 15 MG PO TABS: 15.0000 mg | ORAL_TABLET | Freq: Every day | ORAL | 0 refills | Status: DC

## 2021-07-21 NOTE — Patient Instructions (Addendum)
Stop using ibuprofen. Instead, use meloxicam once daily.  Take this with food. If it bothers your stomach, you can cut the dose in half. If you need any other pain medication (ie headache), tylenol is the only thing you can take (no advil, aleve, goody/Bc powder). Use heat and massage. Ice only after activities that inflame/flare the pain.  Use the methocarbamol (muscle relaxant) just if needed for spasm of the muscle.  This muscle relaxant is the least sedating of the various types, but still use with caution when you first take it. Don't mix with alcohol.  If it isn't sedating, then you likely will be fine to drive.   Send [place] copies of your labs from your GYN (or sign a release form to have them sent directly here).  I'd like to review these prior to your physical to see what exactly is needed.   I recommend getting the new shingles vaccine (Shingrix). You may want to check with your insurance to verify what your out of pocket cost may be (usually covered as preventative, but better to verify to avoid any surprises, as this vaccine is expensive), and then schedule a nurse visit at our office when convenient (based on the possible side effects as discussed).   This is a series of 2 injections, spaced 2 months apart.  It doesn't have to be exactly 2 months apart (but can't be sooner), if that isn't feasible for your schedule, but try and get them close to 2 months (and definitely within 6 months of each other, or else the efficacy of the vaccine drops off). This should be separated from other vaccines by at least 2 weeks.

## 2021-07-26 ENCOUNTER — Telehealth: Payer: Self-pay | Admitting: *Deleted

## 2021-07-26 NOTE — Telephone Encounter (Signed)
-----   Message from Joselyn Arrow, MD sent at 07/21/2021  5:12 PM EDT ----- Need to get records from GYN (pt asked to get labs--you may want to speak to her to see what she has at home)--she had labs in 2022; unsure if she had cologuard repeated.  Need mammo results

## 2021-07-26 NOTE — Telephone Encounter (Signed)
Spoke with patient and she got all her records from GYN and she is bringing to 09/01/21 appt. She also wanted me to let you know that she can get Shingrix free at [facility] and will get th first one ASAP. I will send request to [facility] to get last few mammograms and biopsy as her GYN records did not have those she said.

## 2021-08-18 ENCOUNTER — Other Ambulatory Visit: Payer: Self-pay | Admitting: Family Medicine

## 2021-08-18 DIAGNOSIS — M542 Cervicalgia: Secondary | ICD-10-CM

## 2021-08-29 NOTE — Patient Instructions (Incomplete)
  HEALTH MAINTENANCE RECOMMENDATIONS:  It is recommended that you get at least 30 minutes of aerobic exercise at least 5 days/week (for weight loss, you may need as much as 60-90 minutes). This can be any activity that gets your heart rate up. This can be divided in 10-15 minute intervals if needed, but try and build up your endurance at least once a week.  Weight bearing exercise is also recommended twice weekly.  Eat a healthy diet with lots of vegetables, fruits and fiber.  "Colorful" foods have a lot of vitamins (ie green vegetables, tomatoes, red peppers, etc).  Limit sweet tea, regular sodas and alcoholic beverages, all of which has a lot of calories and sugar.  Up to 1 alcoholic drink daily may be beneficial for women (unless trying to lose weight, watch sugars).  Drink a lot of water.  Calcium recommendations are 1200-1500 mg daily (1500 mg for postmenopausal women or women without ovaries), and vitamin D 1000 IU daily.  This should be obtained from diet and/or supplements (vitamins), and calcium should not be taken all at once, but in divided doses.  Monthly self breast exams and yearly mammograms for women over the age of 36 is recommended.  Sunscreen of at least SPF 30 should be used on all sun-exposed parts of the skin when outside between the hours of 10 am and 4 pm (not just when at beach or pool, but even with exercise, golf, tennis, and yard work!)  Use a sunscreen that says "broad spectrum" so it covers both UVA and UVB rays, and make sure to reapply every 1-2 hours.  Remember to change the batteries in your smoke detectors when changing your clock times in the spring and fall. Carbon monoxide detectors are recommended for your home.  Use your seat belt every time you are in a car, and please drive safely and not be distracted with cell phones and texting while driving.  Yearly flu shots are recommended.  You can return for a nurse visit or get elsewhere.  You will be due to get  your second Shingrix vaccine after 10/15 from the pharmacy.  I recommend that you get the updated COVID booster when available (in the Fall, pay attention to the news).  Continue to do home stretches, heat, massage. You can use topical medications as needed (SalonPas or Biofreeze). Call us for a refill on the meloxicam if your neck pain persists/worsens. Physical therapy is also a good option. Let us know of a preferred location if/when you're needing PT.  With your next labs we should check for HIV and Hepatitis C if these have never been screened for. If you find these in your records, send them to me and I will update your chart. If not done, we will do these next year with your labs for your physical.

## 2021-08-29 NOTE — Progress Notes (Unsigned)
No chief complaint on file.  Patient is a 56 yo female who presents for annual physical exam. She is under the care of Artelia Laroche for her GYN care. She was seen in July with complaints of neck pain. She was prescribed meloxicam and robaxin (and advised PT was next step if not improving).  She was supposed to get records (including her labs) from her GYN prior to this visit.  Health maintenance: Immunization History  Administered Date(s) Administered   Influenza Split 11/24/2011   Influenza,inj,Quad PF,6+ Mos 10/07/2014, 03/07/2018   Influenza-Unspecified 10/11/2018   Moderna Covid-19 Vaccine Bivalent Booster 15yrs & up 10/02/2020   Moderna Sars-Covid-2 Vaccination 01/24/2019, 02/13/2019, 11/30/2019   Tdap 08/18/2016   Zoster Recombinat (Shingrix) 08/17/2021   Last Pap smear:10/2018 Last mammogram: 02/2021 Last colonoscopy: never. She had negative Cologuard in 06/2017 Last DEXA: never Dentist: once yearly Ophtho: yearly Exercise:  3-4 times/week (bike, elliptical and weights) Had labs through her GYN's office. The last ones we have available are: 10/2018 TC 216; HDL 83; LDL 120; TG 75; TSH 2.85, nl CBC, c-met, A1c 5.2   PMH, PSH, SH and FH were reviewed and updated.    ROS:  The patient denies anorexia, fever, weight changes, headaches,  vision changes, decreased hearing, ear pain, sore throat, breast concerns, chest pain, palpitations, dizziness, syncope, dyspnea on exertion, cough, swelling, nausea, vomiting, diarrhea, constipation, abdominal pain, melena, hematochezia, indigestion/heartburn, hematuria, incontinence, dysuria, irregular menstrual cycles, vaginal discharge, odor or itch, genital lesions, joint pains, numbness, tingling, weakness, tremor, suspicious skin lesions, depression, anxiety, abnormal bleeding/bruising, or enlarged lymph nodes.  Neck pain?   PHYSICAL EXAM: LMP 06/29/2010  Wt Readings from Last 3 Encounters:  07/21/21 144 lb 6.4 oz (65.5 kg)   08/18/16 139 lb 12.8 oz (63.4 kg)  12/07/15 146 lb 12.8 oz (66.6 kg)    General Appearance:    Alert, cooperative, no distress, appears stated age  Head:    Normocephalic, without obvious abnormality, atraumatic  Eyes:    PERRL, conjunctiva/corneas clear, EOM's intact, fundi    benign  Ears:    Normal TM's and external ear canals  Nose:   Nares normal, mucosa normal, no drainage or sinus   tenderness  Throat:   Lips, mucosa, and tongue normal; teeth and gums normal  Neck:   Supple, no lymphadenopathy;  thyroid:  no enlargement/ tenderness/nodules; no carotid bruit or JVD  Back:    Spine nontender, no curvature, ROM normal, no CVA     tenderness  Lungs:     Clear to auscultation bilaterally without wheezes, rales or     ronchi; respirations unlabored  Chest Wall:    No tenderness or deformity   Heart:    Regular rate and rhythm, S1 and S2 normal, no murmur, rub   or gallop  Breast Exam:    Deferred to GYN  Abdomen:     Soft, non-tender, nondistended, normoactive bowel sounds,    no masses, no hepatosplenomegaly  Genitalia:    Deferred to GYN     Extremities:   No clubbing, cyanosis or edema  Pulses:   2+ and symmetric all extremities  Skin:   Skin color, texture, turgor normal, no rashes or lesions  Lymph nodes:   Cervical, supraclavicular, and axillary nodes normal  Neurologic:   CNII-XII intact, normal strength, sensation and gait; reflexes 2+ and symmetric throughout          Psych:   Normal mood, affect, hygiene and grooming.     ASSESSMENT/PLAN:  Did she have another Cologuard or colonoscopy? Last cologuard was 06/2017, due again 2022 (was prev ordered by her GYN)  Flu shot Schedule [place] for 2nd shingrix after 10/15 COVID booster when available  Discussed monthly self breast exams and yearly mammograms at least 30 minutes of aerobic activity at least 5 days/week and weight-bearing exercise at least 2x/week; proper sunscreen use reviewed; healthy diet, including goals of  calcium and vitamin D intake and alcohol recommendations (less than or equal to 1 drink/day) reviewed; regular seatbelt use; changing batteries in smoke detectors.  Immunization recommendations discussed-- Flu shot Updated COVID booster when available. To get 2nd Shingrix after 10/17/21. Colon cancer screening  Schedule [place] for Shingrix after 10/15 F/u 1 year

## 2021-09-01 ENCOUNTER — Encounter: Payer: Self-pay | Admitting: Family Medicine

## 2021-09-01 ENCOUNTER — Ambulatory Visit (INDEPENDENT_AMBULATORY_CARE_PROVIDER_SITE_OTHER): Payer: BC Managed Care – PPO | Admitting: Family Medicine

## 2021-09-01 VITALS — BP 120/70 | HR 68 | Ht 64.5 in | Wt 142.2 lb

## 2021-09-01 DIAGNOSIS — Z0000 Encounter for general adult medical examination without abnormal findings: Secondary | ICD-10-CM | POA: Diagnosis not present

## 2021-09-01 DIAGNOSIS — M542 Cervicalgia: Secondary | ICD-10-CM

## 2021-09-01 DIAGNOSIS — Z1211 Encounter for screening for malignant neoplasm of colon: Secondary | ICD-10-CM

## 2021-09-01 LAB — POCT URINALYSIS DIP (PROADVANTAGE DEVICE)
Bilirubin, UA: NEGATIVE
Blood, UA: NEGATIVE
Glucose, UA: NEGATIVE mg/dL
Ketones, POC UA: NEGATIVE mg/dL
Leukocytes, UA: NEGATIVE
Nitrite, UA: NEGATIVE
Protein Ur, POC: NEGATIVE mg/dL
Specific Gravity, Urine: 1.005
Urobilinogen, Ur: NEGATIVE
pH, UA: 6 (ref 5.0–8.0)

## 2021-09-09 ENCOUNTER — Encounter: Payer: Self-pay | Admitting: Family Medicine

## 2021-09-20 LAB — COLOGUARD: COLOGUARD: NEGATIVE

## 2022-01-31 ENCOUNTER — Telehealth: Payer: Self-pay | Admitting: Family Medicine

## 2022-01-31 NOTE — Telephone Encounter (Signed)
Pt called and wanted to let you know that she got her 2nd shingles shot 01/28/2022

## 2022-02-18 LAB — HM MAMMOGRAPHY

## 2022-02-23 ENCOUNTER — Encounter: Payer: Self-pay | Admitting: *Deleted

## 2022-09-27 NOTE — Progress Notes (Unsigned)
No chief complaint on file.  Patient is a 57 yo female who presents for annual physical exam.  She is under the care of Marlinda Mike for her GYN care.   Last year she was treated for neck pain with meloxicam and robaxin.  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  As reviewed last year: labs from 03/2020 from GYN: TC 232, TG 83, HDL 100, LDL 115 Normal CBC, fglu 89, c-met normal. A1c 5.3% TSH 3.42 Vitamin D-OH 41.4 She reports previously vitamin D was low, treated in 2020 with with Rx D. Prior labs were in 10/2018: TC 216; HDL 83; LDL 120; TG 75; TSH 2.85, nl CBC, c-met, A1c 5.2   Health maintenance: Immunization History  Administered Date(s) Administered   Influenza Split 11/24/2011   Influenza,inj,Quad PF,6+ Mos 10/07/2014, 03/07/2018   Influenza-Unspecified 10/11/2018   Moderna Covid-19 Vaccine Bivalent Booster 61yrs & up 10/02/2020   Moderna Sars-Covid-2 Vaccination 01/24/2019, 02/13/2019, 11/30/2019   Tdap 08/18/2016   Zoster Recombinant(Shingrix) 08/17/2021, 01/26/2022   Last Pap smear:03/06/20, normal, no high risk HPV detected Last mammogram: 02/2022 Last colonoscopy: never. She had negative Cologuard in 09/2021 Last DEXA: never Dentist: once yearly Ophtho: yearly Exercise: [employer] 3-4 times/week (bike, elliptical and weights)  Has been keto diet since 2020, though less strict.   PMH, PSH, SH and FH were reviewed and updated.   ROS:  The patient denies anorexia, fever, weight changes, headaches,  vision changes, decreased hearing, ear pain, sore throat, breast concerns, chest pain, palpitations, dizziness, syncope, dyspnea on exertion, cough, swelling, nausea, vomiting, diarrhea, constipation, abdominal pain, melena, hematochezia, indigestion/heartburn, hematuria, incontinence, dysuria, irregular menstrual cycles, vaginal discharge,  odor or itch, genital lesions, joint pains, numbness, tingling, weakness, tremor, suspicious skin lesions, depression, anxiety, abnormal bleeding/bruising, or enlarged lymph nodes. Hair is a little thinner, gradual. Neck pain?    PHYSICAL EXAM:  LMP 06/29/2010   Wt Readings from Last 3 Encounters:  09/01/21 142 lb 3.2 oz (64.5 kg)  07/21/21 144 lb 6.4 oz (65.5 kg)  08/18/16 139 lb 12.8 oz (63.4 kg)    General Appearance:    Alert, cooperative, no distress, appears stated age  Head:    Normocephalic, without obvious abnormality, atraumatic  Eyes:    PERRL, conjunctiva/corneas clear, EOM's intact, fundi    benign  Ears:    Normal TM's and external ear canals  Nose:   Nares normal, mucosa normal, no drainage or sinus   tenderness  Throat:   Lips, mucosa, and tongue normal; teeth and gums normal  Neck:   Supple, no lymphadenopathy;  thyroid:  no enlargement/ tenderness/nodules; no carotid bruit or JVD  Back:    Spine nontender, no curvature, ROM normal, no CVA     tenderness  Lungs:     Clear to auscultation bilaterally without wheezes, rales or     ronchi; respirations unlabored  Chest Wall:    No tenderness or deformity   Heart:    Regular rate and rhythm, S1 and S2 normal, no murmur, rub   or gallop  Breast Exam:    Deferred to GYN  Abdomen:     Soft, non-tender, nondistended, normoactive bowel sounds,    no masses, no hepatosplenomegaly  Genitalia:    Deferred to GYN     Extremities:   No clubbing, cyanosis or edema  Pulses:   2+ and symmetric all extremities  Skin:  Skin color, texture, turgor normal, no rashes. Left anterior forearm/wrist--12x77mm square, slightly raised, light brown nevus.  Lymph nodes:   Cervical, supraclavicular, and axillary nodes normal  Neurologic:   CNII-XII intact, normal strength, sensation and gait; reflexes 2+ and symmetric throughout          Psych:   Normal mood, affect, hygiene and grooming.     Update skin/nevus L wrist  ***    ASSESSMENT/PLAN:  Has she seen GYN this year? Did she have labs with them?   Flu, COVID   Discussed monthly self breast exams and yearly mammograms at least 30 minutes of aerobic activity at least 5 days/week and weight-bearing exercise at least 2x/week; proper sunscreen use reviewed; healthy diet, including goals of calcium and vitamin D intake and alcohol recommendations (less than or equal to 1 drink/day) reviewed; regular seatbelt use; changing batteries in smoke detectors.  Immunization recommendations discussed--yearly flu shot recommended (pt declined). Updated COVID booster when available. To get 2nd Shingrix after 10/17/21. Colon cancer screening discussed, prefers to repeat Cologuard, ordered.   F/u 1 year

## 2022-09-28 ENCOUNTER — Ambulatory Visit (INDEPENDENT_AMBULATORY_CARE_PROVIDER_SITE_OTHER): Payer: BC Managed Care – PPO | Admitting: Family Medicine

## 2022-09-28 ENCOUNTER — Encounter: Payer: Self-pay | Admitting: Family Medicine

## 2022-09-28 VITALS — BP 128/84 | HR 64 | Ht 64.5 in | Wt 142.8 lb

## 2022-09-28 DIAGNOSIS — Z23 Encounter for immunization: Secondary | ICD-10-CM

## 2022-09-28 DIAGNOSIS — Z114 Encounter for screening for human immunodeficiency virus [HIV]: Secondary | ICD-10-CM

## 2022-09-28 DIAGNOSIS — E559 Vitamin D deficiency, unspecified: Secondary | ICD-10-CM | POA: Diagnosis not present

## 2022-09-28 DIAGNOSIS — Z0000 Encounter for general adult medical examination without abnormal findings: Secondary | ICD-10-CM | POA: Diagnosis not present

## 2022-09-28 DIAGNOSIS — Z1159 Encounter for screening for other viral diseases: Secondary | ICD-10-CM

## 2022-09-28 LAB — LIPID PANEL

## 2022-09-28 LAB — POCT URINALYSIS DIP (PROADVANTAGE DEVICE)
Bilirubin, UA: NEGATIVE
Blood, UA: NEGATIVE
Glucose, UA: NEGATIVE mg/dL
Ketones, POC UA: NEGATIVE mg/dL
Nitrite, UA: NEGATIVE
Protein Ur, POC: NEGATIVE mg/dL
Specific Gravity, Urine: 1.01
Urobilinogen, Ur: 0.2
pH, UA: 6.5 (ref 5.0–8.0)

## 2022-09-28 NOTE — Patient Instructions (Signed)
  HEALTH MAINTENANCE RECOMMENDATIONS:  It is recommended that you get at least 30 minutes of aerobic exercise at least 5 days/week (for weight loss, you may need as much as 60-90 minutes). This can be any activity that gets your heart rate up. This can be divided in 10-15 minute intervals if needed, but try and build up your endurance at least once a week.  Weight bearing exercise is also recommended twice weekly.  Eat a healthy diet with lots of vegetables, fruits and fiber.  "Colorful" foods have a lot of vitamins (ie green vegetables, tomatoes, red peppers, etc).  Limit sweet tea, regular sodas and alcoholic beverages, all of which has a lot of calories and sugar.  Up to 1 alcoholic drink daily may be beneficial for women (unless trying to lose weight, watch sugars).  Drink a lot of water.  Calcium recommendations are 1200-1500 mg daily (1500 mg for postmenopausal women or women without ovaries), and vitamin D 1000 IU daily.  This should be obtained from diet and/or supplements (vitamins), and calcium should not be taken all at once, but in divided doses.  Monthly self breast exams and yearly mammograms for women over the age of 69 is recommended.  Sunscreen of at least SPF 30 should be used on all sun-exposed parts of the skin when outside between the hours of 10 am and 4 pm (not just when at beach or pool, but even with exercise, golf, tennis, and yard work!)  Use a sunscreen that says "broad spectrum" so it covers both UVA and UVB rays, and make sure to reapply every 1-2 hours.  Remember to change the batteries in your smoke detectors when changing your clock times in the spring and fall. Carbon monoxide detectors are recommended for your home.  Use your seat belt every time you are in a car, and please drive safely and not be distracted with cell phones and texting while driving.  Yearly breast/pelvic (GYN) exams are recommended.

## 2022-09-29 LAB — CBC WITH DIFFERENTIAL/PLATELET
Basophils Absolute: 0.1 10*3/uL (ref 0.0–0.2)
Basos: 1 %
EOS (ABSOLUTE): 0.2 10*3/uL (ref 0.0–0.4)
Eos: 3 %
Hematocrit: 44.7 % (ref 34.0–46.6)
Hemoglobin: 14.3 g/dL (ref 11.1–15.9)
Immature Grans (Abs): 0 10*3/uL (ref 0.0–0.1)
Immature Granulocytes: 0 %
Lymphocytes Absolute: 1.5 10*3/uL (ref 0.7–3.1)
Lymphs: 29 %
MCH: 31.4 pg (ref 26.6–33.0)
MCHC: 32 g/dL (ref 31.5–35.7)
MCV: 98 fL — ABNORMAL HIGH (ref 79–97)
Monocytes Absolute: 0.4 10*3/uL (ref 0.1–0.9)
Monocytes: 7 %
Neutrophils Absolute: 3.1 10*3/uL (ref 1.4–7.0)
Neutrophils: 60 %
Platelets: 172 10*3/uL (ref 150–450)
RBC: 4.56 x10E6/uL (ref 3.77–5.28)
RDW: 12.5 % (ref 11.7–15.4)
WBC: 5.2 10*3/uL (ref 3.4–10.8)

## 2022-09-29 LAB — CMP14+EGFR
ALT: 13 IU/L (ref 0–32)
AST: 16 IU/L (ref 0–40)
Albumin: 4.7 g/dL (ref 3.8–4.9)
Alkaline Phosphatase: 57 IU/L (ref 44–121)
BUN/Creatinine Ratio: 20 (ref 9–23)
BUN: 17 mg/dL (ref 6–24)
Bilirubin Total: 0.3 mg/dL (ref 0.0–1.2)
CO2: 24 mmol/L (ref 20–29)
Calcium: 9.7 mg/dL (ref 8.7–10.2)
Chloride: 100 mmol/L (ref 96–106)
Creatinine, Ser: 0.87 mg/dL (ref 0.57–1.00)
Globulin, Total: 2.8 g/dL (ref 1.5–4.5)
Glucose: 94 mg/dL (ref 70–99)
Potassium: 4.4 mmol/L (ref 3.5–5.2)
Sodium: 141 mmol/L (ref 134–144)
Total Protein: 7.5 g/dL (ref 6.0–8.5)
eGFR: 77 mL/min/{1.73_m2} (ref >59)

## 2022-09-29 LAB — HIV ANTIBODY (ROUTINE TESTING W REFLEX)

## 2022-09-29 LAB — LIPID PANEL
Cholesterol, Total: 250 mg/dL — ABNORMAL HIGH (ref 100–199)
HDL: 100 mg/dL (ref >39)
LDL CALC COMMENT:: 2.5 ratio (ref 0.0–4.4)
LDL Chol Calc (NIH): 138 mg/dL — ABNORMAL HIGH (ref 0–99)
Triglycerides: 73 mg/dL (ref 0–149)
VLDL Cholesterol Cal: 12 mg/dL (ref 5–40)

## 2022-09-29 LAB — VITAMIN D 25 HYDROXY (VIT D DEFICIENCY, FRACTURES): Vit D, 25-Hydroxy: 76.5 ng/mL (ref 30.0–100.0)

## 2022-09-29 LAB — HEPATITIS C ANTIBODY

## 2022-12-05 ENCOUNTER — Ambulatory Visit (INDEPENDENT_AMBULATORY_CARE_PROVIDER_SITE_OTHER): Payer: BC Managed Care – PPO

## 2022-12-05 ENCOUNTER — Encounter: Payer: Self-pay | Admitting: Podiatry

## 2022-12-05 ENCOUNTER — Ambulatory Visit (INDEPENDENT_AMBULATORY_CARE_PROVIDER_SITE_OTHER): Payer: BC Managed Care – PPO | Admitting: Podiatry

## 2022-12-05 DIAGNOSIS — M779 Enthesopathy, unspecified: Secondary | ICD-10-CM | POA: Diagnosis not present

## 2022-12-05 DIAGNOSIS — M2012 Hallux valgus (acquired), left foot: Secondary | ICD-10-CM | POA: Diagnosis not present

## 2022-12-05 NOTE — Progress Notes (Signed)
Subjective:   Patient ID: April Mcintyre, female   DOB: 57 y.o.   MRN: [id number]   HPI Patient presents stating concerned about bunion deformity of the left foot stating she has significant family history of this and feels like it is gradually become more involved.  Also does have moderate structural deformity of feet in general and patient does not smoke likes to be active   Review of Systems  All other systems reviewed and are negative.       Objective:  Physical Exam Vitals and nursing note reviewed.  Constitutional:      Appearance: She is well-developed.  Pulmonary:     Effort: Pulmonary effort is normal.  Musculoskeletal:        General: Normal range of motion.  Skin:    General: Skin is warm.  Neurological:     Mental Status: She is alert.     Neurovascular status found to be intact muscle strength found to be adequate range of motion within normal limits.  Patient is noted to have hyperostosis medial aspect first metatarsal head left over right with moderate redness and currently minimal discomfort.  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  Patient will be seen back to recheck  X-rays indicate moderate elevation of the 1 to angle left with enlargement of the head of the first metatarsal head left foot

## 2023-03-01 ENCOUNTER — Encounter: Payer: Self-pay | Admitting: Family Medicine

## 2023-03-01 LAB — HM MAMMOGRAPHY

## 2023-05-15 ENCOUNTER — Telehealth: Payer: Self-pay | Admitting: Family Medicine

## 2023-05-15 NOTE — Telephone Encounter (Signed)
 Copied from CRM [id number]. Topic: Clinical - Medication Refill >> May 15, 2023  1:41 PM Jalayah J wrote: Medication: moloxicam 15 mg, methocardamol 500 mg (not on list)  Has the patient contacted their pharmacy? Yes (Agent: If no, request that the patient contact the pharmacy for the refill. If patient does not wish to contact the pharmacy document the reason why and proceed with request.) (Agent: If yes, when and what did the pharmacy advise?)  This is the patient's preferred pharmacy:  [id number] [place], [place] - [address] AT [facility] [address] [facility] [address] [facility] [address] [place] [address] Phone: [phone] Fax: [phone]  Is this the correct pharmacy for this prescription? Yes If no, delete pharmacy and type the correct one.   Has the prescription been filled recently? No  Is the patient out of the medication? No  Has the patient been seen for an appointment in the last year OR does the patient have an upcoming appointment? Yes  Can we respond through MyChart? Yes  Agent: Please be advised that Rx refills may take up to 3 business days. We ask that you follow-up with your pharmacy.

## 2023-05-15 NOTE — Telephone Encounter (Signed)
 Is this okay?

## 2023-05-15 NOTE — Telephone Encounter (Signed)
 Last Fill: Meloxicam : 07/21/21     Robaxin : 07/21/21  Last OV: 09/28/22 Next OV: 10/12/23  Routing to provider for review/authorization.

## 2023-05-15 NOTE — Telephone Encounter (Signed)
 These were last rx'd in 07/2021. At her last physical she reported doing well with regard to neck pain (cervical pillow, doing regular stretches, massages once a month, didn't need any meds).  Verify that this is for her neck. Normally this should have a visit. If for neck, not severe, without any numbness, tingling or weakness, can refill. If for something else, or pain is different/severe, any numbness, tingling or weakness, needs eval

## 2023-05-16 ENCOUNTER — Telehealth: Payer: Self-pay | Admitting: *Deleted

## 2023-05-16 NOTE — Telephone Encounter (Signed)
 Copied from CRM [id number]. Topic: Clinical - Prescription Issue >> May 16, 2023  8:10 AM Carlatta H wrote: Reason for CRM: Please call the patient about prescription there is a crm created for it already//  Scheduled pt w/Dr. Monnie Anthony for tomorrow.

## 2023-05-16 NOTE — Progress Notes (Unsigned)
 No chief complaint on file.  Pt requested refill of Meloxicam  and Robaxin , both prevoiusly prescribed in 07/21/21 for neck pain.  She had reported wanting these for back pain, which has never been assessed here.   Lab Results  Component Value Date   CREATININE 0.87 09/28/2022    PMH, PSH, SH reviewed   ROS:    PHYSICAL EXAM:  LMP 06/29/2010   Wt Readings from Last 3 Encounters:  09/28/22 142 lb 12.8 oz (64.8 kg)  09/01/21 142 lb 3.2 oz (64.5 kg)  07/21/21 144 lb 6.4 oz (65.5 kg)       ASSESSMENT/PLAN:

## 2023-05-16 NOTE — Telephone Encounter (Signed)
 Scheduled patient for tomorrow w/Dr.Knapp as this is her lower back.

## 2023-05-17 ENCOUNTER — Encounter: Payer: Self-pay | Admitting: Family Medicine

## 2023-05-17 ENCOUNTER — Ambulatory Visit (INDEPENDENT_AMBULATORY_CARE_PROVIDER_SITE_OTHER): Admitting: Family Medicine

## 2023-05-17 VITALS — BP 132/82 | HR 72 | Temp 98.4°F | Ht 64.5 in | Wt 142.0 lb

## 2023-05-17 DIAGNOSIS — M7062 Trochanteric bursitis, left hip: Secondary | ICD-10-CM

## 2023-05-17 DIAGNOSIS — R8290 Unspecified abnormal findings in urine: Secondary | ICD-10-CM

## 2023-05-17 DIAGNOSIS — M545 Low back pain, unspecified: Secondary | ICD-10-CM

## 2023-05-17 LAB — POCT URINALYSIS DIP (PROADVANTAGE DEVICE)
Bilirubin, UA: NEGATIVE
Blood, UA: NEGATIVE
Glucose, UA: NEGATIVE mg/dL
Ketones, POC UA: NEGATIVE mg/dL
Nitrite, UA: NEGATIVE
Protein Ur, POC: NEGATIVE mg/dL
Specific Gravity, Urine: 1.005
Urobilinogen, Ur: 0.2
pH, UA: 7.5 (ref 5.0–8.0)

## 2023-05-17 MED ORDER — METHOCARBAMOL 500 MG PO TABS: 500.0000 mg | ORAL_TABLET | Freq: Three times a day (TID) | ORAL | 0 refills | Status: DC | PRN

## 2023-05-17 MED ORDER — TRAMADOL HCL 50 MG PO TABS: 50.0000 mg | ORAL_TABLET | Freq: Three times a day (TID) | ORAL | 0 refills | Status: AC | PRN

## 2023-05-17 MED ORDER — MELOXICAM 15 MG PO TABS: 15.0000 mg | ORAL_TABLET | Freq: Every day | ORAL | 0 refills | Status: DC

## 2023-05-17 NOTE — Patient Instructions (Addendum)
 Take meloxicam  once daily with food. Do not use any ibuprofen or other anti-inflammatories. You may continue to use tylenol along with it, if needed for pain. You can use the tramadol if needed for severe pain--use with caution, it can cause sedation.  I am also refilling the muscle relaxant.  This typically doesn't help for straightforward bursitis.  Your recent other pains suggest that you have some muscular issues as well, so use this as needed for any muscle spasm.  You can continue to use topical medications such as Biofreeze, or SalonPas lidocaine patches.  If you aren't improving next week (while I'm on vacation), you can try getting in with Sports Medicine through [facility] (no referral needed).    You can call over to [facility] physical therapy (on [address]), or [facility], or [facility] (on [address]) to see if they can get you in quickly--both for your back issues and your hip pain. You shouldn't need referrals for those places. If you prefer somewhere else, or they say they need a referral ,let us  know and we can put one in.

## 2023-05-19 NOTE — Progress Notes (Signed)
 [name], PhD, LAT, ATC acting as a scribe for Garlan Juniper, MD.  April Mcintyre is a 58 y.o. female who presents to [facility] at [facility] today for L hip and low back pain x 2-3 wks. Initial injury occurred trying to carry a treadmill upstairs and then again when bending to clean a window sill. Pain worsened 5/14. Pt locates pain to left hip would not let her weight bare. There was some muscle spasming in the thigh and the on the lateral side of leg. Tuesday morning no change after medication and full day of rest. Wednesday morning went to doctor's office did full examine and she thought it was acute bursitis. Left center glute was tight. Thursday still in pain half a pill of Tramodol. Friday was a little bit better. 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 Yesterday she stood up with the walker and felt no pain. When she is seated she feels the left hip is tight. Yesterday evening after the performance she did not feel any pain did not need any assistance. Slept well.   She is feeling much better now than she was last week.  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  Exam:  BP 102/60   Pulse 83   Ht 5' 4.5" (1.638 m)   Wt 142 lb 12.8 oz (64.8 kg)   LMP 06/29/2010   SpO2 97%   BMI 24.13 kg/m  General: Well Developed, well nourished, and in no acute distress.   MSK: L-spine: Normal appearing Nontender palpation spinal midline. Decreased lumbar motion.  Left hip: Normal-appearing. Normal motion. Mildly tender palpation greater trochanter. Hip abduction strength and external rotation  strength are intact.  Some reproduction of discomfort with piriformis stretch.    Lab and Radiology Results  X-ray images left hip obtained today personally and independently interpreted. No significant degenerative changes.  No acute fractures. Await formal radiology review   Assessment and Plan: 58 y.o. female with chronic left posterior and lateral hip pain due to muscle spasm and dysfunction of the hip abductor and rotator muscle groups.  When the referral was made she was having severe debilitating pain.  At that time injection would absolutely have been the thing to do.  However over the weekend the last few days her pain has subsided a fair amount and we both agreed that an injection can be avoided today.  Happy to do an injection in the near future if the pain returns.  For now we will refer to physical therapy and obtain of hip x-ray.  PT could be quite helpful at settling this hip and low back pain down and trying to prevent it from happening again.  Consider Pilates after physical therapy ends.   PDMP not reviewed this encounter. Orders Placed This Encounter  Procedures   DG HIP UNILAT WITH PELVIS 2-3 VIEWS LEFT    Standing Status:   Future    Number of Occurrences:   1    Expiration Date:   05/21/2024    Reason for Exam (SYMPTOM  OR DIAGNOSIS REQUIRED):   hip pain    Is patient pregnant?:   No    Preferred imaging location?:   [place]  [facility]   Ambulatory referral to Physical Therapy    Referral Priority:   Routine    Referral Type:   Physical Medicine    Referral Reason:   Specialty Services Required    Requested Specialty:   Physical Therapy    Number of Visits Requested:   1   No orders of the defined types were placed in this encounter.    Discussed warning signs or symptoms. Please see discharge instructions. Patient expresses understanding.   The above documentation has been reviewed and is accurate and complete Garlan Juniper, M.D.

## 2023-05-21 LAB — URINE CULTURE

## 2023-05-22 ENCOUNTER — Ambulatory Visit (INDEPENDENT_AMBULATORY_CARE_PROVIDER_SITE_OTHER): Payer: Self-pay

## 2023-05-22 ENCOUNTER — Ambulatory Visit: Admitting: Family Medicine

## 2023-05-22 VITALS — BP 102/60 | HR 83 | Ht 64.5 in | Wt 142.8 lb

## 2023-05-22 DIAGNOSIS — M25552 Pain in left hip: Secondary | ICD-10-CM

## 2023-05-22 MED ORDER — NITROFURANTOIN MONOHYD MACRO 100 MG PO CAPS: 100.0000 mg | ORAL_CAPSULE | Freq: Two times a day (BID) | ORAL | 0 refills | Status: DC

## 2023-05-22 NOTE — Addendum Note (Signed)
 Addended by: Watson Hacking on: 05/22/2023 03:01 PM   Modules accepted: Orders

## 2023-05-22 NOTE — Patient Instructions (Addendum)
 Physical therapy at [facility]. Follow up in 6 weeks.

## 2023-05-23 ENCOUNTER — Ambulatory Visit: Payer: Self-pay | Admitting: Family Medicine

## 2023-05-23 NOTE — Progress Notes (Signed)
 X-ray does not show any obvious source of pain.  There is a possible synovial pit in the hip joint which should not hurt.  If the pain returns or would recommend physical therapy and if that does not work I would recommend an MRI.  I think the majority of your pain was due to tendinitis.

## 2023-05-26 ENCOUNTER — Ambulatory Visit: Payer: Self-pay | Admitting: Family Medicine

## 2023-05-26 ENCOUNTER — Encounter: Payer: Self-pay | Admitting: Family Medicine

## 2023-05-26 ENCOUNTER — Ambulatory Visit: Admitting: Family Medicine

## 2023-06-08 ENCOUNTER — Ambulatory Visit (INDEPENDENT_AMBULATORY_CARE_PROVIDER_SITE_OTHER): Admitting: Physical Therapy

## 2023-06-08 ENCOUNTER — Encounter: Payer: Self-pay | Admitting: Physical Therapy

## 2023-06-08 DIAGNOSIS — M6281 Muscle weakness (generalized): Secondary | ICD-10-CM

## 2023-06-08 DIAGNOSIS — M25552 Pain in left hip: Secondary | ICD-10-CM | POA: Diagnosis not present

## 2023-06-08 DIAGNOSIS — M5459 Other low back pain: Secondary | ICD-10-CM

## 2023-06-08 NOTE — Therapy (Unsigned)
 OUTPATIENT PHYSICAL THERAPY LOWER EXTREMITY EVALUATION   Patient Name: April Mcintyre MRN: [id number] DOB:27-Aug-1965, 57 y.o., female Today's Date: 06/09/2023  END OF SESSION:  PT End of Session - 06/08/23 1419     Visit Number 1    Number of Visits 16    Date for PT Re-Evaluation 08/31/23    Authorization Type AUTH REQ VL [id number] with 0 used at time of eval, 6 visits approved from    06/08/2023 - 08/06/2023    Authorization - Visit Number 1    Authorization - Number of Visits 6    Progress Note Due on Visit 10    PT Start Time 1420    PT Stop Time 1515    PT Time Calculation (min) 55 min    Activity Tolerance Patient tolerated treatment well    Behavior During Therapy WFL for tasks assessed/performed             Past Medical History:  Diagnosis Date   Kidney stone    during pregnancy   Vitamin D  deficiency    Past Surgical History:  Procedure Laterality Date   HERNIA REPAIR  1971   INGUINAL HERNIA REPAIR  age 26   left   There are no active problems to display for this patient.   PCP: Roosvelt Colla, MD  REFERRING PROVIDER: Syliva Even, MD  REFERRING DIAG: [phone] (ICD-10-CM) - Pain of left hip  THERAPY DIAG:  Pain in left hip  Muscle weakness (generalized)  Other low back pain  Rationale for Evaluation and Treatment: Rehabilitation  ONSET DATE: Early May  SUBJECTIVE:   SUBJECTIVE STATEMENT: Patient reports that in early May she had debilitating pain that shot down her leg and she went to the ground the pain was so intense.  Reports she does not recall any significant incidents but does remember washing the windowsill and bending and reaching for a little while which may have contributed to the pain.  She also drove a long distance prior to the onset of pain.  Reports that she has a history of low back pain but the leg and left hip were her primary complaint.  Reports that the pain shot across her current left lower leg into her knee but never went past the knee.   Reports she has tried Biofreeze, massage, ice and rest.  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  She even had to use a walker at times because the pain was so bad.  2 Sundays ago she woke up and her pain was gone.  Reports that she still has some tightness in her leg but it is not painful.  Reports she did use some tramadol  to help her get through the performance.  She has had increased stress over the last 7 months taking care of her husband who has had cardiac issues and surgeries.  Reports she is very active usually goes on the elliptical works out likes to swim mostly does arm and neck exercises does tend to baby her back due to a previous back injury years ago after lifting a treadmill  Arms treadmill/ eliptical, swimming, mostly shoulder and neck tension - stretches     PERTINENT HISTORY: hernia repair 1971, neck pain  PAIN:  Are you having pain? Yes: NPRS scale: 2.5/10 Pain location: left hip/leg, groin front Pain description: tightness/stiffness Aggravating factors: sitting Relieving factors: massage  PRECAUTIONS: None  RED FLAGS: None  WEIGHT BEARING RESTRICTIONS: No  FALLS:  Has patient fallen in last 6 months? 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  DIAGNOSTIC FINDINGS:   Left hip xray 05/22/23 IMPRESSION: 1. No acute fracture or dislocation. 2. Well-circumscribed oval lucent likely cyst within the anterior superior left femoral head-neck junction. This may be degenerative. Otherwise, no significant left hip joint space narrowing  or osteoarthrosis radiographic changes are seen. This may represent a synovial herniation pit ("Pitt pit"). These are usually incidental.  PATIENT SURVEYS:  Patient-specific activity functional scoring scheme (Point to one number):  "0" represents "unable to perform." "10" represents "able to perform at prior level. 0 1 2 3 4 5 6 7 8 9  10 (Date and Score) Activity Initial  Activity Eval   ( at worst)    Walking long distances 0     Standing for long periods of time  0    Going up the stairs 0    Additional Additional Total score = sum of the activity scores/number of activities Minimum detectable change (90%CI) for average score = 2 points Minimum detectable change (90%CI) for single activity score = 3 points PSFS developed by: [place] Spitz., & Binkley, J. (1995). Assessing disability and change on individual  patients: a report of a patient specific measure. Physiotherapy [place], 47, [phone]. Reproduced with the permission of the authors  Score: 0   COGNITION: Overall cognitive status: Within functional limits for tasks assessed     SENSATION: Not tested  EDEMA:  None noted    POSTURE: Straight back posture limited lordosis and thoracic kyphosis noted  PALPATION: Tendernss to palpation noted along left and right QL, bilateral glutes glutes, pain with gentle spring testing in lumbar spine and patient hesitant for pressure to be performed performed on the lumbar spine    Lumbar AROM:    EVAL     Flexion  25% limited  *     Extension  75% limited felt good     R ROT       L ROT       R SB  75% limited *    L SB 75% limited *     * Pain   (Blank rows = not tested)    LE Measurements Lower Extremity Right EVAL Left EVAL   A/PROM MMT A/PROM MMT  Hip Flexion      Hip Extension  3+  3+  Hip Abduction      Hip Adduction      Hip Internal rotation      Hip External rotation      Knee Flexion      Knee Extension      Ankle  Dorsiflexion      Ankle Plantarflexion      Ankle Inversion      Ankle Eversion       (Blank rows = not tested) * pain   LOWER EXTREMITY SPECIAL TESTS:  Neg: slump, ely's, SLR  TREATMENT DATE:   06/09/2023  Therapeutic Exercise:  On different types of exercises, stretches, pilates vs yoga, different types of yoga, on anatomy and rationale behind interventions  Supine: bent knee fall outs with muscle activation 2 minutes  Prone:POE stretch 2 minutes  Seated:  Standing: Neuromuscular Re-education:standing and seated posture - education on how to hold body, ergonomic set up at work, long exhale breathing for core activation - verbal and tactile cues 6 minutes Manual Therapy: Therapeutic Activity: Self Care: Trigger Point Dry Needling:  Modalities:    PATIENT EDUCATION:  Education details: on current presentation, on HEP, on clinical outcomes score and POC Person educated: Patient Education method: Programmer, multimedia, Demonstration, and Handouts Education comprehension: verbalized understanding   HOME EXERCISE PROGRAM: [phone] Long exhale breathing   ASSESSMENT:  CLINICAL IMPRESSION: Patient presents to physical therapy with complaints of left lower extremity pain after episode in May ability to move and walk.  Severe pain resolved to 7 days ago but she continues to have tightness in that lower extremity and fear of repeat injury.  Patient presents with limitations in range of motion, strength, posture that are likely contributing to current presentation.  Patient responded well to prone exercises for lumbar extension and educated in posture and goals of physical therapy.  Patient would greatly benefit from skilled PT to address physical impairments to return to optimal function.  OBJECTIVE IMPAIRMENTS: decreased activity tolerance, decreased ROM, decreased  strength, improper body mechanics, postural dysfunction, and pain.   ACTIVITY LIMITATIONS: lifting, bending, sitting, standing, transfers, and locomotion level  PARTICIPATION LIMITATIONS: meal prep, cleaning, driving, community activity, and occupation  PERSONAL FACTORS: Fitness and Past/current experiences are also affecting patient's functional outcome.   REHAB POTENTIAL: Good  CLINICAL DECISION MAKING: Stable/uncomplicated  EVALUATION COMPLEXITY: Low   GOALS: Goals reviewed with patient? yes  SHORT TERM GOALS: Target date: 07/20/2023   Patient will be independent in self management strategies to improve quality of life and functional outcomes. Baseline: New Program Goal status: INITIAL  2.  Patient will report at least 50% improvement in overall symptoms and/or function to demonstrate improved functional mobility Baseline: 0% better Goal status: INITIAL  3.  Patient will demonstrate pain-free lumbar range of motion in all directions Baseline: See above Goal status: INITIAL     LONG TERM GOALS: Target date: 08/31/2023    Patient will report at least 75% improvement in overall symptoms and/or function to demonstrate improved functional mobility Baseline: 0% better Goal status: INITIAL  2.  Patient will score at least 2 points higher on PSFS average to demonstrate change in overall function. Baseline: see above Goal status: INITIAL  3.  Will report performing lower extremity and back exercises regularly to maintain back health Baseline: None currently Goal status: INITIAL     PLAN:  PT FREQUENCY: 1-2x/week for total of 16 visits over 12 weeks certification.  PT DURATION: 12 weeks  PLANNED INTERVENTIONS: 97110-Therapeutic exercises, 97530- Therapeutic activity, 97112- Neuromuscular re-education, [phone]- Self Care, 11914- Manual therapy, [phone]- Gait training, [phone]- Orthotic Fit/training, [phone]- Canalith repositioning, [id number]- Aquatic Therapy, 97014- Electrical  stimulation (unattended), [phone]- Ionotophoresis 4mg /ml Dexamethasone, Patient/Family education, Balance training, Stair training, Taping, Dry Needling, Joint mobilization, Joint manipulation, Spinal manipulation, Spinal mobilization, Cryotherapy, and Moist heat   PLAN FOR NEXT SESSION: Progress prone series, stability, core, breath work, isometrics  Work towards adding lower extremity strengthening exercises into gym routine   7:40 AM, 06/09/23 Tabitha Ewings, DPT Physical Therapy with [facility] [place]

## 2023-06-20 ENCOUNTER — Ambulatory Visit (INDEPENDENT_AMBULATORY_CARE_PROVIDER_SITE_OTHER): Admitting: Physical Therapy

## 2023-06-20 ENCOUNTER — Encounter: Payer: Self-pay | Admitting: Physical Therapy

## 2023-06-20 DIAGNOSIS — M5459 Other low back pain: Secondary | ICD-10-CM | POA: Diagnosis not present

## 2023-06-20 DIAGNOSIS — M25552 Pain in left hip: Secondary | ICD-10-CM | POA: Diagnosis not present

## 2023-06-20 DIAGNOSIS — M6281 Muscle weakness (generalized): Secondary | ICD-10-CM

## 2023-06-20 NOTE — Therapy (Signed)
 OUTPATIENT PHYSICAL THERAPY LOWER EXTREMITY TREATMENT    Patient Name: Breya Cass MRN: [id number] DOB:[birth year], 58 y.o., female Today's Date: 06/20/2023  END OF SESSION:  PT End of Session - 06/20/23 0844     Visit Number 2    Number of Visits 16    Date for PT Re-Evaluation 08/31/23    Authorization Type AUTH REQ VL 30 with 0 used at time of eval, 6 visits approved from    06/08/2023 - 08/06/2023    Authorization - Visit Number 2    Authorization - Number of Visits 6    Progress Note Due on Visit 10    PT Start Time 0845    PT Stop Time 0925    PT Time Calculation (min) 40 min    Activity Tolerance Patient tolerated treatment well    Behavior During Therapy WFL for tasks assessed/performed          Past Medical History:  Diagnosis Date   Kidney stone    during pregnancy   Vitamin D  deficiency    Past Surgical History:  Procedure Laterality Date   HERNIA REPAIR  1971   INGUINAL HERNIA REPAIR  age 3   left   There are no active problems to display for this patient.   PCP: Roosvelt Colla, MD  REFERRING PROVIDER: Syliva Even, MD  REFERRING DIAG: [phone] (ICD-10-CM) - Pain of left hip  THERAPY DIAG:  Pain in left hip  Muscle weakness (generalized)  Other low back pain  Rationale for Evaluation and Treatment: Rehabilitation  ONSET DATE: Early May  SUBJECTIVE:   SUBJECTIVE STATEMENT: 06/20/2023 States she is doing good and doing her exercises. States she has been getting back to yoga and listening to her body. States she has been doing yoga daily. Her movement has gotten a lot better. States she has not had to take anything.   EVAL: Patient reports that in early May she had debilitating pain that shot down her leg and she went to the ground the pain was so intense.  Reports she does not recall any significant incidents but does remember washing the windowsill and bending and reaching for a little while which may have contributed to the pain.  She also  drove a long distance prior to the onset of pain.  Reports that she has a history of low back pain but the leg and left hip were her primary complaint.  Reports that the pain shot across her current left lower leg into her knee but never went past the knee.  Reports she has tried Biofreeze, massage, ice and rest.  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  She even had to use a walker at times because the pain was so bad.  2 Sundays ago she woke up and her pain was gone.  Reports that she still has some tightness in her leg but it is not painful.  Reports she did use some tramadol  to help her get through the performance.  She has had increased stress over the last 7 months taking care of her husband who has had cardiac issues and surgeries.  Reports she is very active usually goes on the elliptical works out likes to swim mostly does arm and neck exercises does tend to baby her back due to a previous back injury years ago after lifting a treadmill  Arms treadmill/ eliptical, swimming, mostly shoulder and neck tension -  stretches     PERTINENT HISTORY: hernia repair 1971, neck pain  PAIN:  Are you having pain? Yes: NPRS scale: 0/10 Pain location: left hip/leg, groin front Pain description: tightness/stiffness Aggravating factors: sitting Relieving factors: massage  PRECAUTIONS: None  RED FLAGS: None   WEIGHT BEARING RESTRICTIONS: No  FALLS:  Has patient fallen in last 6 months? 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  DIAGNOSTIC FINDINGS:   Left hip xray  05/22/23 IMPRESSION: 1. No acute fracture or dislocation. 2. Well-circumscribed oval lucent likely cyst within the anterior superior left femoral head-neck junction. This may be degenerative. Otherwise, no significant left hip joint space narrowing or osteoarthrosis radiographic changes are seen. 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 These are usually incidental.  PATIENT SURVEYS:  Patient-specific activity functional scoring scheme (Point to one number):  0 represents "unable to perform." 10 represents "able to perform at prior level. 0 1 2 3 4 5 6 7 8 9  10 (Date and Score) Activity Initial  Activity Eval   ( at worst)    Walking long distances 0     Standing for long periods of time  0    Going up the stairs 0    Additional Additional Total score = sum of the activity scores/number of activities Minimum detectable change (90%CI) for average score = 2 points Minimum detectable change (90%CI) for single activity score = 3 points PSFS developed by: [place] Spitz., & Binkley, J. (1995). Assessing disability and change on individual  patients: a report of a patient specific measure. Physiotherapy [place], 47, [phone]. Reproduced with the permission of the authors  Score: 0   COGNITION: Overall cognitive status: Within functional limits for tasks assessed     SENSATION: Not tested  EDEMA:  None noted    POSTURE: Straight back posture limited lordosis and thoracic kyphosis noted  PALPATION: Tendernss to palpation noted along left and right QL, bilateral glutes glutes, pain with gentle spring testing in lumbar spine and patient hesitant for pressure to be performed performed on the lumbar spine    Lumbar AROM:    EVAL     Flexion  25% limited  *     Extension  75% limited felt good     R ROT       L ROT       R SB  75% limited *    L SB 75% limited *     * Pain   (Blank rows = not tested)    LE Measurements Lower  Extremity Right EVAL Left EVAL   A/PROM MMT A/PROM MMT  Hip Flexion      Hip Extension  3+  3+  Hip Abduction      Hip Adduction      Hip Internal rotation      Hip External rotation      Knee Flexion      Knee Extension      Ankle Dorsiflexion      Ankle Plantarflexion      Ankle Inversion      Ankle Eversion       (Blank  rows = not tested) * pain   LOWER EXTREMITY SPECIAL TESTS:  Neg: slump, ely's, SLR                                                                                                                                TREATMENT DATE:   06/20/2023  Therapeutic Exercise:  On return to exercises/yoga/pilates Supine: scapular protraction - difficult to perform without PT assist   Prone: laying over pilates ball with focus on posterior rib expansion - 4 minutes tolerated well   Seated:  Standing: Neuromuscular Re-education:breathing assessment and exercise, long exhale breathing 4 minutes, lateral costal breathing -8 minutes, seated with ball for improved scapular protraction and posture - 8 minutes  Manual Therapy: Therapeutic Activity: Self Care: Trigger Point Dry Needling:  Modalities:    PATIENT EDUCATION:  Education details: on HEP Person educated: Patient Education method: Programmer, multimedia, Demonstration, and Handouts Education comprehension: verbalized understanding   HOME EXERCISE PROGRAM: [phone] Long exhale breathing  Lateral costal breathing Laying over pilates ball - with head lift Sitting with pilates ball at chest for scapular protraction   ASSESSMENT:  CLINICAL IMPRESSION: 06/20/2023 Focused on breathing exercises and posture. Tolerated well with no increase in pain. Focus was to quiet upper traps with all shoulder movements as they are over active. No symptoms noted end of session. Added all new exercises to HEP, will continue with current POC as tolerated.    EVAL: Patient presents to physical therapy with complaints of left lower  extremity pain after episode in May ability to move and walk.  Severe pain resolved to 7 days ago but she continues to have tightness in that lower extremity and fear of repeat injury.  Patient presents with limitations in range of motion, strength, posture that are likely contributing to current presentation.  Patient responded well to prone exercises for lumbar extension and educated in posture and goals of physical therapy.  Patient would greatly benefit from skilled PT to address physical impairments to return to optimal function.  OBJECTIVE IMPAIRMENTS: decreased activity tolerance, decreased ROM, decreased strength, improper body mechanics, postural dysfunction, and pain.   ACTIVITY LIMITATIONS: lifting, bending, sitting, standing, transfers, and locomotion level  PARTICIPATION LIMITATIONS: meal prep, cleaning, driving, community activity, and occupation  PERSONAL FACTORS: Fitness and Past/current experiences are also affecting patient's functional outcome.   REHAB POTENTIAL: Good  CLINICAL DECISION MAKING: Stable/uncomplicated  EVALUATION COMPLEXITY: Low   GOALS: Goals reviewed with patient? yes  SHORT TERM GOALS: Target date: 07/20/2023   Patient will be independent in self management strategies to improve quality of life and functional outcomes. Baseline: New Program Goal status: INITIAL  2.  Patient will report at least 50% improvement in overall symptoms and/or function to demonstrate improved functional mobility Baseline: 0% better Goal status: INITIAL  3.  Patient will demonstrate pain-free lumbar range of motion in all directions Baseline: See above Goal status: INITIAL     LONG TERM GOALS: Target  date: 08/31/2023    Patient will report at least 75% improvement in overall symptoms and/or function to demonstrate improved functional mobility Baseline: 0% better Goal status: INITIAL  2.  Patient will score at least 2 points higher on PSFS average to demonstrate  change in overall function. Baseline: see above Goal status: INITIAL  3.  Will report performing lower extremity and back exercises regularly to maintain back health Baseline: None currently Goal status: INITIAL     PLAN:  PT FREQUENCY: 1-2x/week for total of 16 visits over 12 weeks certification.  PT DURATION: 12 weeks  PLANNED INTERVENTIONS: 97110-Therapeutic exercises, 97530- Therapeutic activity, 97112- Neuromuscular re-education, [phone]- Self Care, 24401- Manual therapy, [phone]- Gait training, [phone]- Orthotic Fit/training, [phone]- Canalith repositioning, [id number]- Aquatic Therapy, 97014- Electrical stimulation (unattended), [phone]- Ionotophoresis 4mg /ml Dexamethasone, Patient/Family education, Balance training, Stair training, Taping, Dry Needling, Joint mobilization, Joint manipulation, Spinal manipulation, Spinal mobilization, Cryotherapy, and Moist heat   PLAN FOR NEXT SESSION: Progress prone series, stability, core, breath work, isometrics  Work towards adding lower extremity strengthening exercises into gym routine   12:07 PM, 06/20/23 Tabitha Ewings, DPT Physical Therapy [facility] 

## 2023-07-03 ENCOUNTER — Ambulatory Visit: Payer: Self-pay | Admitting: Family Medicine

## 2023-07-04 ENCOUNTER — Encounter: Admitting: Physical Therapy

## 2023-07-11 ENCOUNTER — Encounter: Admitting: Physical Therapy

## 2023-07-20 ENCOUNTER — Encounter: Payer: Self-pay | Admitting: Physical Therapy

## 2023-07-20 ENCOUNTER — Ambulatory Visit: Admitting: Physical Therapy

## 2023-07-20 DIAGNOSIS — M6281 Muscle weakness (generalized): Secondary | ICD-10-CM | POA: Diagnosis not present

## 2023-07-20 DIAGNOSIS — M5459 Other low back pain: Secondary | ICD-10-CM

## 2023-07-20 DIAGNOSIS — M25552 Pain in left hip: Secondary | ICD-10-CM | POA: Diagnosis not present

## 2023-07-20 NOTE — Therapy (Signed)
 OUTPATIENT PHYSICAL THERAPY LOWER EXTREMITY TREATMENT    Patient Name: April Mcintyre MRN: [id number] DOB:1965-11-21, 58 y.o., female Today's Date: 07/20/2023  END OF SESSION:  PT End of Session - 07/20/23 1300     Visit Number 3    Number of Visits 16    Date for PT Re-Evaluation 08/31/23    Authorization Type AUTH REQ VL 30 with 0 used at time of eval, 6 visits approved from    06/08/2023 - 08/06/2023    Authorization - Visit Number 3    Authorization - Number of Visits 6    Progress Note Due on Visit 10    PT Start Time 1300    PT Stop Time 1340    PT Time Calculation (min) 40 min    Activity Tolerance Patient tolerated treatment well    Behavior During Therapy WFL for tasks assessed/performed           Past Medical History:  Diagnosis Date   Kidney stone    during pregnancy   Vitamin D  deficiency    Past Surgical History:  Procedure Laterality Date   HERNIA REPAIR  1971   INGUINAL HERNIA REPAIR  age 77   left   There are no active problems to display for this patient.   PCP: Randol Dawes, MD  REFERRING PROVIDER: Joane Artist RAMAN, MD  REFERRING DIAG: [phone] (ICD-10-CM) - Pain of left hip  THERAPY DIAG:  Pain in left hip  Muscle weakness (generalized)  Other low back pain  Rationale for Evaluation and Treatment: Rehabilitation  ONSET DATE: Early May  SUBJECTIVE:   SUBJECTIVE STATEMENT: 07/20/2023 States she is feeling much better. Deep tissue and yoga has been helpful. 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 Can still feel some tightness in her bilat thighs.    EVAL: Patient reports that in early May she had debilitating pain that shot down her leg and she went to the ground the pain was so intense.  Reports she does not recall any significant incidents but does remember washing the windowsill and bending and reaching for a little while which may have contributed to the pain.  She also drove a long distance prior to the onset of pain.  Reports that she has a  history of low back pain but the leg and left hip were her primary complaint.  Reports that the pain shot across her current left lower leg into her knee but never went past the knee.  Reports she has tried Biofreeze, massage, ice and rest.  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  She even had to use a walker at times because the pain was so bad.  2 Sundays ago she woke up and her pain was gone.  Reports that she still has some tightness in her leg but it is not painful.  Reports she did use some tramadol  to help her get through the performance.  She has had increased stress over the last 7 months taking care of her husband who has had cardiac issues and surgeries.  Reports she is very active usually goes on the elliptical works out likes to swim mostly does arm and neck exercises does tend to baby her back due to a previous back injury years ago after lifting a treadmill  Arms treadmill/ eliptical, swimming, mostly shoulder and neck tension - stretches     PERTINENT HISTORY: hernia repair 1971, neck pain  PAIN:  Are you having pain? Yes: NPRS scale: 0/10 Pain location: left hip/leg, groin front Pain description: tightness/stiffness Aggravating factors: sitting Relieving factors: massage  PRECAUTIONS: None  RED FLAGS: None   WEIGHT BEARING RESTRICTIONS: No  FALLS:  Has patient fallen in last 6 months? 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  DIAGNOSTIC FINDINGS:   Left hip xray 05/22/23 IMPRESSION: 1. No acute fracture or dislocation. 2. Well-circumscribed  oval lucent likely cyst within the anterior superior left femoral head-neck junction. This may be degenerative. Otherwise, no significant left hip joint space narrowing or osteoarthrosis radiographic changes are seen. 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 These are usually incidental.  PATIENT SURVEYS:  Patient-specific activity functional scoring scheme (Point to one number):  0 represents "unable to perform." 10 represents "able to perform at prior level. 0 1 2 3 4 5 6 7 8 9  10 (Date and Score) Activity Initial  Activity Eval   ( at worst) 07/20/23   Walking long distances 0   10  Standing for long periods of time  0  10  Going up the stairs 0 10   Additional Additional Total score = sum of the activity scores/number of activities Minimum detectable change (90%CI) for average score = 2 points Minimum detectable change (90%CI) for single activity score = 3 points PSFS developed by: Rosalee MYRTIS Marvis KYM Charlet CHRISTELLA., & Binkley, J. (1995). Assessing disability and change on individual  patients: a report of a patient specific measure. Physiotherapy [place], 47, [phone]. Reproduced with the permission of the authors  Score: 0   COGNITION: Overall cognitive status: Within functional limits for tasks assessed     SENSATION: Not tested  EDEMA:  None noted  POSTURE: Straight back posture limited lordosis and thoracic kyphosis noted  PALPATION: Tendernss to palpation noted along left and right QL, bilateral glutes glutes, pain with gentle spring testing in lumbar spine and patient hesitant for pressure to be performed performed on the lumbar spine    Lumbar AROM:    EVAL 07/20/23    Flexion  25% limited  *  100%   Extension  75% limited felt good  100%   R ROT       L ROT       R SB  75% limited * 100%   L SB 75% limited * 100%    * Pain   (Blank rows = not tested)    LE Measurements Lower Extremity Right EVAL Left EVAL   A/PROM MMT  A/PROM MMT  Hip Flexion      Hip Extension  3+  3+  Hip Abduction      Hip Adduction      Hip Internal rotation      Hip External rotation      Knee Flexion      Knee Extension      Ankle Dorsiflexion      Ankle Plantarflexion      Ankle Inversion      Ankle Eversion       (Blank rows = not tested) * pain   LOWER EXTREMITY SPECIAL TESTS:  Neg: slump, ely's,  SLR                                                                                                                                TREATMENT DATE:   07/20/2023 Therapeutic Exercise:  On return to exercises/yoga/pilates   Supine: hip/quad stretch with strap x30   Prone: quad stretch with strap x30  Seated:  Standing: quad stretch x 30, lumbar ext against counter x10, lumbar ext against wall x10 Neuromuscular Re-education: PNE education Manual Therapy: Therapeutic Activity: Self Care: Trigger Point Dry Needling:  Modalities:    PATIENT EDUCATION:  Education details: on HEP Person educated: Patient Education method: Programmer, multimedia, Facilities manager, and Handouts Education comprehension: verbalized understanding   HOME EXERCISE PROGRAM: [phone] Long exhale breathing  Lateral costal breathing Laying over pilates ball - with head lift Sitting with pilates ball at chest for scapular protraction   ASSESSMENT:  CLINICAL IMPRESSION: 07/20/2023 Pt has been feeling much improved and back to her normal. Still has some fear that her pain will return limiting mobility. Educated pt on pain neuroscience this session. Discussed listening to her body and appropriately applying ice/heat. Pt feels confident with performing HEP and continuing her yoga and massage. Finalized HEP to include more stretches to address some continued thigh and hip tightness. Discussed placing pt on hold a month hold as she continues to work on exercises independently at home and then to call clinic if any problems or issues.  EVAL: Patient presents to  physical therapy with complaints of left lower extremity pain after episode in May ability to move and walk.  Severe pain resolved to 7 days ago but she continues to have tightness in that lower extremity and fear of repeat injury.  Patient presents with limitations in range of motion, strength, posture that are likely contributing to current presentation.  Patient responded well to prone exercises for lumbar extension and educated in posture and goals of physical therapy.  Patient would greatly benefit from skilled PT to address physical impairments to return to optimal function.  OBJECTIVE IMPAIRMENTS: decreased activity tolerance, decreased ROM, decreased strength, improper body mechanics, postural dysfunction, and pain.   ACTIVITY LIMITATIONS: lifting, bending, sitting, standing, transfers, and locomotion level  PARTICIPATION LIMITATIONS: meal prep, cleaning, driving, community activity, and occupation  PERSONAL FACTORS: Fitness and Past/current experiences are also affecting patient's functional outcome.   REHAB POTENTIAL: Good  CLINICAL DECISION MAKING: Stable/uncomplicated  EVALUATION COMPLEXITY: Low   GOALS: Goals reviewed with patient? yes  SHORT TERM GOALS: Target date: 07/20/2023   Patient will be independent in self management strategies to improve quality of life and functional outcomes. Baseline: New Program Goal status: INITIAL  2.  Patient will report at least 50% improvement in overall symptoms and/or function to demonstrate improved functional mobility Baseline: 0% better 07/20/23: 90% improvement Goal status: MET  3.  Patient will demonstrate pain-free lumbar range of motion in all directions Baseline: See above Goal status: MET 07/20/23  LONG TERM GOALS: Target date: 08/31/2023    Patient will report at least 75% improvement in overall symptoms and/or function to demonstrate improved functional mobility Baseline: 0% better 07/20/23: 90% Goal status:  MET  2.  Patient will score at least 2 points higher on PSFS average to demonstrate change in overall function. Baseline: see above Goal status: MET 07/20/23  3.  Will report performing lower extremity and back exercises regularly to maintain back health Baseline: None currently Goal status: IN PROGRESS 07/20/23     PLAN:  PT FREQUENCY: 1-2x/week for total of 16 visits over 12 weeks certification.  PT DURATION: 12 weeks  PLANNED INTERVENTIONS: 97110-Therapeutic exercises, 97530- Therapeutic activity, [id number]- Neuromuscular re-education, [phone]- Self Care, 02859- Manual therapy, [phone]- Gait training, [phone]- Orthotic Fit/training, [phone]- Canalith repositioning, [id number]- Aquatic Therapy, 97014- Electrical stimulation (unattended), [phone]- Ionotophoresis 4mg /ml Dexamethasone, Patient/Family education, Balance training, Stair training, Taping, Dry Needling, Joint mobilization, Joint manipulation, Spinal manipulation, Spinal mobilization, Cryotherapy, and Moist heat   PLAN FOR NEXT SESSION: Progress prone series, stability, core, breath work, isometrics  Work towards adding lower extremity strengthening exercises into gym routine   1:45 PM, 07/20/23 Durwood Dittus April Ma L Benecio Kluger, PT, DPT Physical Therapy with [place]

## 2023-07-27 ENCOUNTER — Encounter: Admitting: Physical Therapy

## 2023-08-03 ENCOUNTER — Encounter: Admitting: Physical Therapy

## 2023-10-11 ENCOUNTER — Telehealth: Payer: Self-pay | Admitting: Family Medicine

## 2023-10-11 NOTE — Patient Instructions (Incomplete)
  HEALTH MAINTENANCE RECOMMENDATIONS:  It is recommended that you get at least 30 minutes of aerobic exercise at least 5 days/week (for weight loss, you may need as much as 60-90 minutes). This can be any activity that gets your heart rate up. This can be divided in 10-15 minute intervals if needed, but try and build up your endurance at least once a week.  Weight bearing exercise is also recommended twice weekly.  Eat a healthy diet with lots of vegetables, fruits and fiber.  Colorful foods have a lot of vitamins (ie green vegetables, tomatoes, red peppers, etc).  Limit sweet tea, regular sodas and alcoholic beverages, all of which has a lot of calories and sugar.  Up to 1 alcoholic drink daily may be beneficial for women (unless trying to lose weight, watch sugars).  Drink a lot of water.  Calcium recommendations are 1200-1500 mg daily (1500 mg for postmenopausal women or women without ovaries), and vitamin D  1000 IU daily.  This should be obtained from diet and/or supplements (vitamins), and calcium should not be taken all at once, but in divided doses.  Monthly self breast exams and yearly mammograms for women over the age of 31 is recommended.  Sunscreen of at least SPF 30 should be used on all sun-exposed parts of the skin when outside between the hours of 10 am and 4 pm (not just when at beach or pool, but even with exercise, golf, tennis, and yard work!)  Use a sunscreen that says broad spectrum so it covers both UVA and UVB rays, and make sure to reapply every 1-2 hours.  Remember to change the batteries in your smoke detectors when changing your clock times in the spring and fall. Carbon monoxide detectors are recommended for your home.  Use your seat belt every time you are in a car, and please drive safely and not be distracted with cell phones and texting while driving.  We discussed Prevnar-20 and Capvaxive-21 (pneumonia vaccines, you only need one or the other, just once). We  carry the Prevnar-20. You can return for a nurse visit or get one of these from the pharmacy, at your convenience.  We discussed the calcium scans.  Let us  know if you are interested.

## 2023-10-11 NOTE — Progress Notes (Unsigned)
 No chief complaint on file.  Patient is a 58 yo female who presents for annual physical exam.  She is under the care of Glenys Benders for her GYN care. Hasn't seen her since she moved to [place], thinking she only needed to go when paps were needed.  She denies any GYN concerns today. *** UPDATE  She was last seen in May with left hip and low back pain.  She saw Dr. Joane, had normal hip x-rays (normal, possible synovial pit), felt to be tendonitis, and she was referred for PT. Urine culture at that time showed E.coli, but due to lack of symptoms, was not treated with antibiotics (her back pain had resolved), suspecting colonization.  She has periodic issues with neck discomfort, which has been alleviated by doing home exercises, stretches, and getting massages monthly.  Hyperlipidemia: LDL was noted to be higher last year, with LDL 138. It had been 115 in 03/2020 and 120 in 10/2018 through GYN.  HDL has always been excellent.  She had reported not being as strict with her keto diet, doing more Atkins, Mediterranean. She was having red meat 4x/week (bacon, sausage, some steak, some lean ground beef). Eggs--just 1 yolk/week, more whites, and loves cheese.  Today she reports  ***UPDATE DIET   Lab Results  Component Value Date   CHOL 250 (H) 09/28/2022   HDL 100 09/28/2022   LDLCALC 138 (H) 09/28/2022   TRIG 73 09/28/2022   CHOLHDL 2.5 09/28/2022     Health maintenance: Immunization History  Administered Date(s) Administered   Influenza Split 11/24/2011   Influenza,inj,Quad PF,6+ Mos 10/07/2014, 03/07/2018   Influenza-Unspecified 10/11/2018   Moderna Covid-19 Vaccine Bivalent Booster 8yrs & up 10/02/2020   Moderna Sars-Covid-2 Vaccination 01/24/2019, 02/13/2019, 11/30/2019   Tdap 08/18/2016   Zoster Recombinant(Shingrix) 08/17/2021, 01/26/2022   Last Pap smear:03/06/20, normal, no high risk HPV detected Last mammogram: 02/2023 [facility]) Last colonoscopy: never. She had negative  Cologuard in 09/2021 Last DEXA: never Dentist: once yearly Ophtho: yearly Exercise:   [employer] 3-4 times/week (bike, elliptical and weights)  Vitamin D  76.5 09/2022 (was 41.4 through GYN in 03/2020, with h/o deficiency, treated with Rx D through GYN in past)  Normal CBC and c-met (including fasting glu) 09/2022.   PMH, PSH, SH and FH were reviewed and updated.    ROS:  The patient denies anorexia, fever, weight changes, headaches,  vision changes, decreased hearing, ear pain, sore throat, breast concerns, chest pain, palpitations, dizziness, syncope, dyspnea on exertion, cough, swelling, nausea, vomiting, diarrhea, constipation, abdominal pain, melena, hematochezia, indigestion/heartburn, hematuria, incontinence, dysuria, irregular menstrual cycles, vaginal discharge, odor or itch, genital lesions, joint pains, numbness, tingling, weakness, tremor, suspicious skin lesions, depression, anxiety, abnormal bleeding/bruising, or enlarged lymph nodes. Hair is a little thinner, gradual. Takes biotin occasionally.  No significant change. No further neck pain. Hip or back pain ***  Sees dermatologist    PHYSICAL EXAM:  LMP 06/29/2010   Wt Readings from Last 3 Encounters:  05/22/23 142 lb 12.8 oz (64.8 kg)  05/17/23 142 lb (64.4 kg)  09/28/22 142 lb 12.8 oz (64.8 kg)    General Appearance:    Alert, cooperative, no distress, appears stated age  Head:    Normocephalic, without obvious abnormality, atraumatic  Eyes:    PERRL, conjunctiva/corneas clear, EOM's intact, fundi    benign  Ears:    Normal TM's and external ear canals  Nose:   Nares normal, mucosa normal, no drainage or sinus   tenderness  Throat:  Lips, mucosa, and tongue normal; teeth and gums normal  Neck:   Supple, no lymphadenopathy;  thyroid:  no enlargement/ tenderness/nodules; no carotid bruit or JVD  Back:    Spine nontender, no curvature, ROM normal, no CVA     tenderness  Lungs:     Clear to auscultation  bilaterally without wheezes, rales or     ronchi; respirations unlabored  Chest Wall:    No tenderness or deformity   Heart:    Regular rate and rhythm, S1 and S2 normal, no murmur, rub   or gallop  Breast Exam:    Deferred to GYN  Abdomen:     Soft, non-tender, nondistended, normoactive bowel sounds,    no masses, no hepatosplenomegaly  Genitalia:    Deferred to GYN     Extremities:   No clubbing, cyanosis or edema  Pulses:   2+ and symmetric all extremities  Skin:   Skin color, texture, turgor normal, no rashes. Left anterior forearm/wrist--12x52mm square, slightly raised, light brown nevus (unchanged, and also checked by derm). Skin exam is limited as she elected not to change into gown today.  Lymph nodes:   Cervical, supraclavicular nodes normal  Neurologic:   CNII-XII intact, normal strength, sensation and gait; reflexes 2+ and symmetric throughout          Psych:   Normal mood, affect, hygiene and grooming.    ***update skin L forearm  ASSESSMENT/PLAN:  Has she seen GYN? Plans to?  Flu, covid rec (declined last year) Prevnar-20 (not all 3; if declines either of the others, give prevnar)   Needs urine if any urinary complaints (+e.coli on culture when she came for back pain, that wasn't treated).  Lipids. ?if she wants other tests (rest all fine last year, unless change in D supplements or symptoms)   Discussed monthly self breast exams and yearly mammograms at least 30 minutes of aerobic activity at least 5 days/week and weight-bearing exercise at least 2x/week; proper sunscreen use reviewed; healthy diet, including goals of calcium and vitamin D  intake and alcohol recommendations (less than or equal to 1 drink/day) reviewed; regular seatbelt use; changing batteries in smoke detectors.  Immunization recommendations discussed--yearly flu shot recommended (pt declined). COVID booster recommended, declined. *** Prevnar-20 ***  Colon cancer screening discussed, up to date.   Cologuard due again 09/2024. She was encouraged to see GYN yearly, even if pap smears needed q3-5 years.   F/u 1 year

## 2023-10-11 NOTE — Telephone Encounter (Signed)
 Copied from CRM #[id number]. Topic: Appointments - Scheduling Inquiry for Clinic >> Oct 11, 2023 10:39 AM Edsel HERO wrote: Patient is scheduled for her physical tomorrow morning at 845 and would like to have her labs done before her appointment instead of after. No lab orders in chart. Please advise.

## 2023-10-11 NOTE — Telephone Encounter (Signed)
 Patient will come at 8:10 fasting, tomorrow.

## 2023-10-12 ENCOUNTER — Encounter: Payer: Self-pay | Admitting: Family Medicine

## 2023-10-12 ENCOUNTER — Ambulatory Visit (INDEPENDENT_AMBULATORY_CARE_PROVIDER_SITE_OTHER): Payer: BC Managed Care – PPO | Admitting: Family Medicine

## 2023-10-12 VITALS — BP 120/78 | HR 76 | Ht 65.0 in | Wt 145.0 lb

## 2023-10-12 DIAGNOSIS — E785 Hyperlipidemia, unspecified: Secondary | ICD-10-CM

## 2023-10-12 DIAGNOSIS — Z0000 Encounter for general adult medical examination without abnormal findings: Secondary | ICD-10-CM | POA: Diagnosis not present

## 2023-10-12 DIAGNOSIS — E559 Vitamin D deficiency, unspecified: Secondary | ICD-10-CM | POA: Diagnosis not present

## 2023-10-13 ENCOUNTER — Ambulatory Visit: Payer: Self-pay | Admitting: Family Medicine

## 2023-10-13 LAB — LIPID PANEL
Chol/HDL Ratio: 2.6 ratio (ref 0.0–4.4)
Cholesterol, Total: 248 mg/dL — ABNORMAL HIGH (ref 100–199)
HDL: 97 mg/dL (ref >39)
LDL Chol Calc (NIH): 138 mg/dL — ABNORMAL HIGH (ref 0–99)
Triglycerides: 79 mg/dL (ref 0–149)
VLDL Cholesterol Cal: 13 mg/dL (ref 5–40)

## 2023-10-13 LAB — TSH: TSH: 3.36 u[IU]/mL (ref 0.450–4.500)

## 2023-10-13 LAB — COMPREHENSIVE METABOLIC PANEL WITH GFR
ALT: 13 IU/L (ref 0–32)
AST: 16 IU/L (ref 0–40)
Albumin: 4.8 g/dL (ref 3.8–4.9)
Alkaline Phosphatase: 54 IU/L (ref 49–135)
BUN/Creatinine Ratio: 20 (ref 12–28)
BUN: 17 mg/dL (ref 8–27)
Bilirubin Total: 0.3 mg/dL (ref 0.0–1.2)
CO2: 23 mmol/L (ref 20–29)
Calcium: 9.6 mg/dL (ref 8.7–10.3)
Chloride: 100 mmol/L (ref 96–106)
Creatinine, Ser: 0.85 mg/dL (ref 0.57–1.00)
Globulin, Total: 2.6 g/dL (ref 1.5–4.5)
Glucose: 93 mg/dL (ref 70–99)
Potassium: 4 mmol/L (ref 3.5–5.2)
Sodium: 140 mmol/L (ref 134–144)
Total Protein: 7.4 g/dL (ref 6.0–8.5)
eGFR: 78 mL/min/1.73 (ref >59)

## 2023-10-13 LAB — CBC WITH DIFFERENTIAL/PLATELET
Basophils Absolute: 0.1 x10E3/uL (ref 0.0–0.2)
Basos: 2 %
EOS (ABSOLUTE): 0.2 x10E3/uL (ref 0.0–0.4)
Eos: 4 %
Hematocrit: 43.5 % (ref 34.0–46.6)
Hemoglobin: 14.2 g/dL (ref 11.1–15.9)
Immature Grans (Abs): 0 x10E3/uL (ref 0.0–0.1)
Immature Granulocytes: 0 %
Lymphocytes Absolute: 1.9 x10E3/uL (ref 0.7–3.1)
Lymphs: 39 %
MCH: 31.9 pg (ref 26.6–33.0)
MCHC: 32.6 g/dL (ref 31.5–35.7)
MCV: 98 fL — ABNORMAL HIGH (ref 79–97)
Monocytes Absolute: 0.4 x10E3/uL (ref 0.1–0.9)
Monocytes: 8 %
Neutrophils Absolute: 2.3 x10E3/uL (ref 1.4–7.0)
Neutrophils: 47 %
Platelets: 176 x10E3/uL (ref 150–450)
RBC: 4.45 x10E6/uL (ref 3.77–5.28)
RDW: 12.7 % (ref 11.7–15.4)
WBC: 4.8 x10E3/uL (ref 3.4–10.8)

## 2023-10-13 LAB — VITAMIN D 25 HYDROXY (VIT D DEFICIENCY, FRACTURES): Vit D, 25-Hydroxy: 60.3 ng/mL (ref 30.0–100.0)

## 2024-11-11 ENCOUNTER — Encounter: Payer: Self-pay | Admitting: Family Medicine
